# Patient Record
Sex: Female | Born: 1970 | Race: White | Hispanic: No | Marital: Single | Smoking: Former smoker
Health system: Southern US, Community
[De-identification: ages and names within clinical notes are randomized; demographics above are authoritative.]

## PROBLEM LIST (undated history)

## (undated) ENCOUNTER — Telehealth

## (undated) ENCOUNTER — Ambulatory Visit: Payer: PRIVATE HEALTH INSURANCE

## (undated) ENCOUNTER — Encounter

## (undated) ENCOUNTER — Encounter
Attending: Student in an Organized Health Care Education/Training Program | Primary: Student in an Organized Health Care Education/Training Program

## (undated) ENCOUNTER — Ambulatory Visit

## (undated) DIAGNOSIS — B977 Papillomavirus as the cause of diseases classified elsewhere: Secondary | ICD-10-CM

## (undated) DIAGNOSIS — J351 Hypertrophy of tonsils: Secondary | ICD-10-CM

## (undated) DIAGNOSIS — J3501 Chronic tonsillitis: Secondary | ICD-10-CM

## (undated) DIAGNOSIS — N200 Calculus of kidney: Secondary | ICD-10-CM

## (undated) DIAGNOSIS — IMO0002 Reserved for concepts with insufficient information to code with codable children: Secondary | ICD-10-CM

## (undated) DIAGNOSIS — R351 Nocturia: Secondary | ICD-10-CM

## (undated) DIAGNOSIS — N3090 Cystitis, unspecified without hematuria: Secondary | ICD-10-CM

## (undated) DIAGNOSIS — C801 Malignant (primary) neoplasm, unspecified: Secondary | ICD-10-CM

## (undated) DIAGNOSIS — N201 Calculus of ureter: Secondary | ICD-10-CM

---
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
## 2001-03-06 HISTORY — PX: TUBAL LIGATION: SHX77

## 2008-06-24 ENCOUNTER — Emergency Department: Payer: Self-pay | Admitting: Emergency Medicine

## 2008-12-11 ENCOUNTER — Emergency Department: Payer: Self-pay | Admitting: Emergency Medicine

## 2009-03-06 HISTORY — PX: OTHER SURGICAL HISTORY: SHX169

## 2009-09-12 ENCOUNTER — Emergency Department: Payer: Self-pay | Admitting: Emergency Medicine

## 2010-01-07 ENCOUNTER — Ambulatory Visit: Payer: Self-pay | Admitting: Family

## 2010-08-05 ENCOUNTER — Emergency Department: Payer: Self-pay | Admitting: Emergency Medicine

## 2010-11-18 ENCOUNTER — Emergency Department: Payer: Self-pay | Admitting: Emergency Medicine

## 2011-02-10 ENCOUNTER — Emergency Department: Payer: Self-pay | Admitting: Emergency Medicine

## 2011-03-09 ENCOUNTER — Other Ambulatory Visit: Payer: Self-pay | Admitting: Urology

## 2011-03-10 ENCOUNTER — Other Ambulatory Visit: Payer: Self-pay | Admitting: Urology

## 2011-03-10 DIAGNOSIS — N1330 Unspecified hydronephrosis: Secondary | ICD-10-CM

## 2011-03-16 ENCOUNTER — Encounter (HOSPITAL_COMMUNITY): Payer: Self-pay

## 2011-03-27 ENCOUNTER — Other Ambulatory Visit (HOSPITAL_COMMUNITY): Payer: Self-pay | Admitting: Physician Assistant

## 2011-03-29 ENCOUNTER — Other Ambulatory Visit: Payer: Self-pay | Admitting: Radiology

## 2011-03-30 ENCOUNTER — Encounter (HOSPITAL_COMMUNITY)
Admission: RE | Admit: 2011-03-30 | Discharge: 2011-03-30 | Disposition: A | Discharge status: Home / Self Care | Payer: Medicaid Other | Source: Ambulatory Visit | Attending: Urology | Admitting: Urology

## 2011-03-30 ENCOUNTER — Encounter (HOSPITAL_COMMUNITY): Payer: Self-pay

## 2011-03-30 HISTORY — DX: Nocturia: R35.1

## 2011-03-30 HISTORY — DX: Papillomavirus as the cause of diseases classified elsewhere: B97.7

## 2011-03-30 HISTORY — DX: Cystitis, unspecified without hematuria: N30.90

## 2011-03-30 LAB — CBC
HCT: 40.1 % (ref 36.0–46.0)
MCV: 89.3 fL (ref 78.0–100.0)
Platelets: 472 10*3/uL — ABNORMAL HIGH (ref 150–400)
RBC: 4.49 MIL/uL (ref 3.87–5.11)
WBC: 5.1 10*3/uL (ref 4.0–10.5)

## 2011-03-30 LAB — SURGICAL PCR SCREEN: Staphylococcus aureus: NEGATIVE

## 2011-03-30 NOTE — Patient Instructions (Signed)
20 MAURIANNA BENARD  03/30/2011   Your procedure is scheduled on: 04-03-2011 Report to [facility] radiology [phone] AM.  Call this number if you have problems the morning of surgery: [phone]   Remember:   Do not eat food:After Midnight.   take these medicines the morning of surgery with A SIP OF WATER:albuterol inhaler if needed and bring inhaler, hydrocodone if needed Remove piercing   Do not wear jewelry, make-up or nail polish.  Do not wear lotions, powders, or perfumes.  DO not wear deodorant.  Do not shave 48 hours prior to surgery.  Do not bring valuables to the hospital.  Contacts, dentures or bridgework may not be worn into surgery.  Leave suitcase in the car. After surgery it may be brought to your room.  For patients admitted to the hospital, checkout time is 11:00 AM the day of discharge.   special Instructions: CHG Shower Use Special Wash: 1/2 bottle night before surgery and 1/2 bottle morning of surgery.neck down avoid private area   Please read over the following fact sheets that you were given: MRSA Information, blood fact sheet Cain Sieve, rn [facility] pre op nurse phone number [phone]

## 2011-03-30 NOTE — Pre-Procedure Instructions (Signed)
ekg 08-05-2010 [facility] on chart

## 2011-03-31 NOTE — H&P (Signed)
Urology Admission H&P  Chief Complaint: Staghorn stone left kidney.  History of Present Illness:  Kristy Wallace presents today along with her fiance to further discuss her recently diagnosed staghorn left-sided renal calculus. Apparently she was unable to get any care in [place] and therefore was referred to [facility]. She has had 4-6 months of at least some degree of left-sided back pain but her problems have really intensified the last 2 months. The patient was diagnosed eventually with a staghorn stone involving the left kidney. Urine here last time did show some pyuria but her urine culture was negative. I suspect she will have some chronic pyuria simply based on inflammation from the stone. Approximately a month ago she did however have a febrile illness and probably did have some concurrent pyelonephritis. The patient had a KUB here several days ago which I have reviewed and she did bring the CT disk from [place] today. She has a very large 6 cm in length staghorn stone which appears to extend from the upper pole calyceal system down into the renal pelvis and probably near the ureteropelvic junction. They are here to discuss treatment options for this.       History of Present Illness            40 YO female seen today as a referral from Dr. H. Burns for evaluation of left staghorn renal calculus with mild left hydronephrosis seen on CTU 02/11/11. Has had chronic flank/back pain since 6/12. Recently seen in the ER for gross hematuria and flank pain. H/O nephrolithasis in 2003 with pregnancy which she was able to spontanously pass. Since that time has had not further stones. Developed fever 102 a month ago and was treated for ? UTI/pyelonephritis.   Interval HX: Today denies any further fever. Denies hematuria or passage of stone material. Continues to have nagging left flank pain that is controlled with Hydrocodone 5/500mg PRN.    Past Medical History  Diagnosis Date  . Asthma    exercise induced asthma  . Bladder infection     hx of bladder infection  . Frequent urination at night   . HPV (human papilloma virus) infection    Past Surgical History  Procedure Date  . Dilation and curettage of uterus 1993  . Tubal ligation 2003  . Colposcopy 2011    Home Medications:  No prescriptions prior to admission   Allergies:  Allergies  Allergen Reactions  . Advair Diskus (Fluticasone-Salmeterol) Other (See Comments)    Flu-like symptoms.  . Azithromycin Other (See Comments)    dizziness    No family history on file. Social History:  reports that she has been smoking Cigarettes.  She has a 6 pack-year smoking history. She has never used smokeless tobacco. She reports that she uses illicit drugs (Marijuana). She reports that she does not drink alcohol.  Review of Systems  Constitutional: Positive for malaise/fatigue. Negative for fever.  HENT: Negative.   Cardiovascular: Negative.   Gastrointestinal: Positive for abdominal pain. Negative for vomiting.  Genitourinary: Positive for frequency and flank pain.  Musculoskeletal: Negative.   Neurological: Negative.   Endo/Heme/Allergies: Negative.   Psychiatric/Behavioral: Negative.     Physical Exam:  Vital signs in last 24 hours:   Physical Exam  Constitutional: She is oriented to person, place, and time. She appears well-developed and well-nourished.  Cardiovascular: Normal rate and regular rhythm.   Respiratory: Effort normal.  GI: Soft. She exhibits no distension and no mass. There is no tenderness.  Musculoskeletal: Normal   range of motion.  Neurological: She is alert and oriented to person, place, and time.  Skin: Skin is warm and dry.  Psychiatric: She has a normal mood and affect. Her behavior is normal.    Laboratory Data:  No results found for this or any previous visit (from the past 24 hour(s)). Recent Results (from the past 240 hour(s))  SURGICAL PCR SCREEN     Status: Normal   Collection  Time   03/30/11  8:48 AM      Component Value Range Status Comment   MRSA, PCR NEGATIVE  NEGATIVE  Final    Staphylococcus aureus NEGATIVE  NEGATIVE  Final    Creatinine: No results found for this basename: CREATININE:7 in the last 168 hours Baseline Creatinine:   Impression/Assessment:  Left staghorn calculus.  Plan:  [place] has a substantial staghorn stone emanating from the upper pole of the left kidney extending through the renal pelvis and probably down into the proximal ureter, certainly into the UPJ region. She has had some intermittent pain now for at least a couple of months and has had at least one episode of what sounds like acute pyelonephritis. At this point there is no urgent need for intervention, but clearly she needs to have this stone definitively managed. Fortunately, the renal parenchyma appears preserved and she does not appear to have a substantial degree of hydronephrosis. Clearly the stone is not amenable to lithotripsy or ureteroscopy. Open procedures are an option but clearly a percutaneous nephrolithotomy would be the treatment of choice. It is possible that multiple access would be necessary, but hopefully through a lower pole access we would be able to take care of the stone within the renal pelvis and extending into the upper pole. Obviously the possibility exists that she would need a second look procedure or potentially a lithotripsy after the percutaneous procedure. Ideally we would render her stone free, but it may be more practical to really expect substantial debulking of the stone with potential additional therapy necessary to render her completely stone free. We talked about the surgery itself, the advantages as well as the risks. I would anticipate at least an overnight stay in the hospital and potentially a longer hospitalization depending on how she did clinically. We will attempt to get her on the schedule for some time in the next availability and provide the  disk to the interventional radiologist.   Kristy Wallace S 03/31/2011, 11:36 AM       

## 2011-04-03 ENCOUNTER — Other Ambulatory Visit (HOSPITAL_COMMUNITY): Payer: Medicaid Other

## 2011-04-03 ENCOUNTER — Inpatient Hospital Stay (HOSPITAL_COMMUNITY): Payer: Medicaid Other

## 2011-04-03 ENCOUNTER — Other Ambulatory Visit: Payer: Self-pay | Admitting: Urology

## 2011-04-03 ENCOUNTER — Encounter (HOSPITAL_COMMUNITY)
Admission: RE | Disposition: A | Discharge status: Home / Self Care | Payer: Self-pay | Source: Ambulatory Visit | Attending: Urology

## 2011-04-03 ENCOUNTER — Inpatient Hospital Stay (HOSPITAL_COMMUNITY): Payer: Medicaid Other | Admitting: Anesthesiology

## 2011-04-03 ENCOUNTER — Ambulatory Visit (HOSPITAL_COMMUNITY)
Admission: RE | Admit: 2011-04-03 | Discharge: 2011-04-03 | Disposition: A | Discharge status: Home / Self Care | Payer: Medicaid Other | Source: Ambulatory Visit | Attending: Urology | Admitting: Urology

## 2011-04-03 ENCOUNTER — Inpatient Hospital Stay (HOSPITAL_COMMUNITY)
Admission: RE | Admit: 2011-04-03 | Discharge: 2011-04-05 | DRG: 661 | Disposition: A | Discharge status: Home / Self Care | Payer: Medicaid Other | Source: Ambulatory Visit | Attending: Urology | Admitting: Urology

## 2011-04-03 ENCOUNTER — Encounter (HOSPITAL_COMMUNITY): Payer: Self-pay | Admitting: Anesthesiology

## 2011-04-03 DIAGNOSIS — N200 Calculus of kidney: Principal | ICD-10-CM | POA: Diagnosis present

## 2011-04-03 DIAGNOSIS — R109 Unspecified abdominal pain: Secondary | ICD-10-CM | POA: Diagnosis present

## 2011-04-03 DIAGNOSIS — F1210 Cannabis abuse, uncomplicated: Secondary | ICD-10-CM | POA: Diagnosis present

## 2011-04-03 DIAGNOSIS — J45909 Unspecified asthma, uncomplicated: Secondary | ICD-10-CM | POA: Diagnosis present

## 2011-04-03 DIAGNOSIS — N1330 Unspecified hydronephrosis: Secondary | ICD-10-CM

## 2011-04-03 DIAGNOSIS — R5381 Other malaise: Secondary | ICD-10-CM | POA: Diagnosis present

## 2011-04-03 DIAGNOSIS — F17200 Nicotine dependence, unspecified, uncomplicated: Secondary | ICD-10-CM | POA: Diagnosis present

## 2011-04-03 DIAGNOSIS — N3289 Other specified disorders of bladder: Secondary | ICD-10-CM | POA: Diagnosis not present

## 2011-04-03 DIAGNOSIS — Z01812 Encounter for preprocedural laboratory examination: Secondary | ICD-10-CM

## 2011-04-03 HISTORY — PX: OTHER SURGICAL HISTORY: SHX169

## 2011-04-03 LAB — TYPE AND SCREEN
ABO/RH(D): AB POS
Antibody Screen: NEGATIVE

## 2011-04-03 SURGERY — NEPHROLITHOTOMY PERCUTANEOUS
Anesthesia: General | Laterality: Left | Wound class: Clean Contaminated

## 2011-04-03 MED ORDER — SODIUM CHLORIDE 0.9 % IR SOLN
Status: DC | PRN
  Administered 2011-04-03: 18000 mL

## 2011-04-03 MED ORDER — DEXTROSE 5 % IV SOLN
1.0000 g | INTRAVENOUS | Status: DC
  Administered 2011-04-03 – 2011-04-05 (×3): 1 g via INTRAVENOUS
  Filled 2011-04-03 (×4): qty 10

## 2011-04-03 MED ORDER — LACTATED RINGERS IV SOLN
INTRAVENOUS | Status: DC
  Administered 2011-04-03: 14:00:00 via INTRAVENOUS

## 2011-04-03 MED ORDER — IOHEXOL 300 MG/ML  SOLN
50.0000 mL | Freq: Once | INTRAMUSCULAR | Status: AC | PRN
  Administered 2011-04-03: 20 mL

## 2011-04-03 MED ORDER — ONDANSETRON HCL 4 MG/2ML IJ SOLN
INTRAMUSCULAR | Status: DC | PRN
  Administered 2011-04-03: 4 mg via INTRAVENOUS

## 2011-04-03 MED ORDER — MIDAZOLAM HCL 5 MG/5ML IJ SOLN
INTRAMUSCULAR | Status: DC | PRN
  Administered 2011-04-03: 2 mg via INTRAVENOUS

## 2011-04-03 MED ORDER — FENTANYL CITRATE 0.05 MG/ML IJ SOLN
INTRAMUSCULAR | Status: AC | PRN
  Administered 2011-04-03 (×2): 100 ug via INTRAVENOUS

## 2011-04-03 MED ORDER — FENTANYL CITRATE 0.05 MG/ML IJ SOLN
INTRAMUSCULAR | Status: AC
  Filled 2011-04-03: qty 2

## 2011-04-03 MED ORDER — OXYBUTYNIN CHLORIDE 5 MG PO TABS
5.0000 mg | ORAL_TABLET | Freq: Three times a day (TID) | ORAL | Status: DC | PRN
  Administered 2011-04-04 – 2011-04-05 (×4): 5 mg via ORAL
  Filled 2011-04-03 (×5): qty 1

## 2011-04-03 MED ORDER — DEXTROSE 5 % IV SOLN
1.0000 g | Freq: Once | INTRAVENOUS | Status: DC
  Filled 2011-04-03: qty 10

## 2011-04-03 MED ORDER — HYDROMORPHONE HCL PF 1 MG/ML IJ SOLN
0.5000 mg | INTRAMUSCULAR | Status: DC | PRN
  Administered 2011-04-03 – 2011-04-05 (×15): 1 mg via INTRAVENOUS
  Filled 2011-04-03 (×15): qty 1

## 2011-04-03 MED ORDER — FENTANYL CITRATE 0.05 MG/ML IJ SOLN
INTRAMUSCULAR | Status: DC | PRN
  Administered 2011-04-03: 50 ug via INTRAVENOUS
  Administered 2011-04-03: 100 ug via INTRAVENOUS
  Administered 2011-04-03: 50 ug via INTRAVENOUS

## 2011-04-03 MED ORDER — IOHEXOL 300 MG/ML  SOLN
INTRAMUSCULAR | Status: AC
  Filled 2011-04-03: qty 2

## 2011-04-03 MED ORDER — DROPERIDOL 2.5 MG/ML IJ SOLN
INTRAMUSCULAR | Status: DC | PRN
  Administered 2011-04-03: 0.625 mg via INTRAVENOUS

## 2011-04-03 MED ORDER — MIDAZOLAM HCL 5 MG/5ML IJ SOLN
INTRAMUSCULAR | Status: AC | PRN
  Administered 2011-04-03 (×2): 2 mg via INTRAVENOUS

## 2011-04-03 MED ORDER — CIPROFLOXACIN IN D5W 400 MG/200ML IV SOLN
400.0000 mg | INTRAVENOUS | Status: AC
  Administered 2011-04-03: 400 mg via INTRAVENOUS
  Filled 2011-04-03: qty 200

## 2011-04-03 MED ORDER — FENTANYL CITRATE 0.05 MG/ML IJ SOLN
25.0000 ug | INTRAMUSCULAR | Status: DC | PRN
  Administered 2011-04-03 (×3): 50 ug via INTRAVENOUS

## 2011-04-03 MED ORDER — ROCURONIUM BROMIDE 100 MG/10ML IV SOLN
INTRAVENOUS | Status: DC | PRN
  Administered 2011-04-03: 40 mg via INTRAVENOUS
  Administered 2011-04-03: 15 mg via INTRAVENOUS

## 2011-04-03 MED ORDER — PROMETHAZINE HCL 25 MG/ML IJ SOLN: 6.2500 mg | INTRAMUSCULAR | Status: DC | PRN

## 2011-04-03 MED ORDER — ONDANSETRON HCL 4 MG/2ML IJ SOLN: 4.0000 mg | INTRAMUSCULAR | Status: DC | PRN

## 2011-04-03 MED ORDER — IOHEXOL 300 MG/ML  SOLN
INTRAMUSCULAR | Status: DC | PRN
  Administered 2011-04-03: 10 mL via INTRAVENOUS

## 2011-04-03 MED ORDER — PROPOFOL 10 MG/ML IV EMUL
INTRAVENOUS | Status: DC | PRN
  Administered 2011-04-03: 100 mg via INTRAVENOUS

## 2011-04-03 MED ORDER — OXYCODONE-ACETAMINOPHEN 5-325 MG PO TABS
1.0000 | ORAL_TABLET | ORAL | Status: DC | PRN
  Administered 2011-04-04 – 2011-04-05 (×3): 2 via ORAL
  Filled 2011-04-03 (×3): qty 2

## 2011-04-03 MED ORDER — ALBUTEROL SULFATE HFA 108 (90 BASE) MCG/ACT IN AERS
2.0000 | INHALATION_SPRAY | Freq: Four times a day (QID) | RESPIRATORY_TRACT | Status: DC | PRN
  Filled 2011-04-03: qty 6.7

## 2011-04-03 MED ORDER — SODIUM CHLORIDE 0.9 % IV SOLN: INTRAVENOUS | Status: DC

## 2011-04-03 MED ORDER — LACTATED RINGERS IV SOLN
INTRAVENOUS | Status: DC | PRN
  Administered 2011-04-03 (×2): via INTRAVENOUS

## 2011-04-03 MED ORDER — KCL IN DEXTROSE-NACL 20-5-0.45 MEQ/L-%-% IV SOLN
INTRAVENOUS | Status: DC
  Administered 2011-04-03: 100 mL/h via INTRAVENOUS
  Administered 2011-04-04 – 2011-04-05 (×3): via INTRAVENOUS
  Filled 2011-04-03 (×7): qty 1000

## 2011-04-03 SURGICAL SUPPLY — 42 items
BAG URINE DRAINAGE (UROLOGICAL SUPPLIES) ×2 IMPLANT
BASKET ZERO TIP NITINOL 2.4FR (BASKET) IMPLANT
BENZOIN TINCTURE PRP APPL 2/3 (GAUZE/BANDAGES/DRESSINGS) ×4 IMPLANT
CATH FOLEY 2W COUNCIL 20FR 5CC (CATHETERS) IMPLANT
CATH FOLEY 2W COUNCIL 5CC 18FR (CATHETERS) ×2 IMPLANT
CATH ROBINSON RED A/P 20FR (CATHETERS) IMPLANT
CATH URET 5FR 28IN OPEN ENDED (CATHETERS) ×2 IMPLANT
CATH X-FORCE N30 NEPHROSTOMY (TUBING) ×2 IMPLANT
CLOTH BEACON ORANGE TIMEOUT ST (SAFETY) ×2 IMPLANT
COVER SURGICAL LIGHT HANDLE (MISCELLANEOUS) ×2 IMPLANT
DRAPE C-ARM 42X72 X-RAY (DRAPES) ×2 IMPLANT
DRAPE CAMERA CLOSED 9X96 (DRAPES) ×2 IMPLANT
DRAPE LINGEMAN PERC (DRAPES) ×2 IMPLANT
DRAPE SURG IRRIG POUCH 19X23 (DRAPES) ×2 IMPLANT
DRSG TEGADERM 8X12 (GAUZE/BANDAGES/DRESSINGS) ×4 IMPLANT
GLOVE BIOGEL M STRL SZ7.5 (GLOVE) ×2 IMPLANT
GOWN STRL REIN XL XLG (GOWN DISPOSABLE) ×4 IMPLANT
KIT BASIN OR (CUSTOM PROCEDURE TRAY) ×2 IMPLANT
LASER FIBER DISP (UROLOGICAL SUPPLIES) IMPLANT
LASER FIBER DISP 1000U (UROLOGICAL SUPPLIES) IMPLANT
MANIFOLD NEPTUNE II (INSTRUMENTS) ×2 IMPLANT
NS IRRIG 1000ML POUR BTL (IV SOLUTION) ×2 IMPLANT
PACK BASIC VI WITH GOWN DISP (CUSTOM PROCEDURE TRAY) ×2 IMPLANT
PAD ABD 7.5X8 STRL (GAUZE/BANDAGES/DRESSINGS) ×4 IMPLANT
POSITIONER SURGICAL ARM (MISCELLANEOUS) ×2 IMPLANT
PROBE EHL 9 FR 470CM (MISCELLANEOUS) IMPLANT
PROBE LITHOCLAST ULTRA 3.8X403 (UROLOGICAL SUPPLIES) ×2 IMPLANT
PROBE PNEUMATIC 1.0MMX570MM (UROLOGICAL SUPPLIES) ×2 IMPLANT
SET IRRIG Y TYPE TUR BLADDER L (SET/KITS/TRAYS/PACK) IMPLANT
SET WARMING FLUID IRRIGATION (MISCELLANEOUS) ×2 IMPLANT
SPONGE GAUZE 4X4 12PLY (GAUZE/BANDAGES/DRESSINGS) ×2 IMPLANT
SPONGE LAP 4X18 X RAY DECT (DISPOSABLE) ×2 IMPLANT
STONE CATCHER W/TUBE ADAPTER (UROLOGICAL SUPPLIES) ×2 IMPLANT
SUT SILK 2 0 30  PSL (SUTURE) ×1
SUT SILK 2 0 30 PSL (SUTURE) ×1 IMPLANT
SYR 20CC LL (SYRINGE) ×4 IMPLANT
SYRINGE 10CC LL (SYRINGE) ×2 IMPLANT
TOWEL OR 17X26 10 PK STRL BLUE (TOWEL DISPOSABLE) ×2 IMPLANT
TOWEL OR NON WOVEN STRL DISP B (DISPOSABLE) ×2 IMPLANT
TRAY FOLEY CATH 14FRSI W/METER (CATHETERS) ×2 IMPLANT
TUBING CONNECTING 10 (TUBING) ×4 IMPLANT
WATER STERILE IRR 1500ML POUR (IV SOLUTION) ×2 IMPLANT

## 2011-04-03 NOTE — Procedures (Signed)
Placement of left nephrostomy tube.  Large left renal staghorn calculus.  Catheter tip in urinary bladder.

## 2011-04-03 NOTE — Interval H&P Note (Signed)
History and Physical Interval Note:  04/03/2011 10:59 AM  Kristy Wallace  has presented today for surgery, with the diagnosis of Left Staghorn Stone  The various methods of treatment have been discussed with the patient and family. After consideration of risks, benefits and other options for treatment, the patient has consented to  Procedure(s): NEPHROLITHOTOMY PERCUTANEOUS as a surgical intervention .  The patients' history has been reviewed, patient examined, no change in status, stable for surgery.  I have reviewed the patients' chart and labs.  Questions were answered to the patient's satisfaction.     Narely Nobles S

## 2011-04-03 NOTE — Anesthesia Preprocedure Evaluation (Signed)
Anesthesia Evaluation  Patient identified by MRN, date of birth, ID band Patient awake    Reviewed: Allergy & Precautions, H&P , NPO status , Patient's Chart, lab work & pertinent test results  Airway Mallampati: II TM Distance: >3 FB Neck ROM: Full    Dental No notable dental hx.    Pulmonary asthma , Current Smoker,  clear to auscultation  Pulmonary exam normal       Cardiovascular neg cardio ROS Regular Normal    Neuro/Psych Negative Neurological ROS  Negative Psych ROS   GI/Hepatic negative GI ROS, Neg liver ROS,   Endo/Other  Negative Endocrine ROS  Renal/GU negative Renal ROS  Genitourinary negative   Musculoskeletal negative musculoskeletal ROS (+)   Abdominal   Peds negative pediatric ROS (+)  Hematology negative hematology ROS (+)   Anesthesia Other Findings   Reproductive/Obstetrics negative OB ROS                           Anesthesia Physical Anesthesia Plan  ASA: II  Anesthesia Plan: General   Post-op Pain Management:    Induction: Intravenous  Airway Management Planned: Oral ETT  Additional Equipment:   Intra-op Plan:   Post-operative Plan: Extubation in OR  Informed Consent: I have reviewed the patients History and Physical, chart, labs and discussed the procedure including the risks, benefits and alternatives for the proposed anesthesia with the patient or authorized representative who has indicated his/her understanding and acceptance.   Dental advisory given  Plan Discussed with: CRNA  Anesthesia Plan Comments:         Anesthesia Quick Evaluation

## 2011-04-03 NOTE — H&P (Signed)
Kristy Wallace is an 41 y.o. female.   Chief Complaint: Left kidney stone HPI: Scheduled for left nephrolithotomy by Dr. Isabel Caprice.  History of left flank pain and found to have a staghorn left renal calculus during ED visit.  No flank pain today.  No gross hematuria.    Past Medical History  Diagnosis Date  . Asthma     exercise induced asthma  . Bladder infection     hx of bladder infection  . Frequent urination at night   . HPV (human papilloma virus) infection     Past Surgical History  Procedure Date  . Dilation and curettage of uterus 1993  . Tubal ligation 2003  . Colposcopy 2011    No family history on file. Social History: 1/2 pack smoking per day. Allergies:  Allergies  Allergen Reactions  . Advair Diskus (Fluticasone-Salmeterol) Other (See Comments)    Flu-like symptoms.  . Azithromycin Other (See Comments)    dizziness    Medications Prior to Admission  Medication Sig Dispense Refill  . albuterol (PROVENTIL HFA;VENTOLIN HFA) 108 (90 BASE) MCG/ACT inhaler Inhale 2 puffs into the lungs every 6 (six) hours as needed. For exercise induced asthma.      Marland Kitchen HYDROcodone-acetaminophen (VICODIN) 5-500 MG per tablet Take 1 tablet by mouth every 6 (six) hours as needed. For pain.      Marland Kitchen trimethoprim (TRIMPEX) 100 MG tablet Take 100 mg by mouth daily.       Medications Prior to Admission  Medication Dose Route Frequency Provider Last Rate Last Dose  . 0.9 %  sodium chloride infusion   Intravenous Continuous Art A Hoss, MD      . ciprofloxacin (CIPRO) IVPB 400 mg  400 mg Intravenous On Call Art A Hoss, MD        No results found for this or any previous visit (from the past 48 hour(s)). No results found.  Review of Systems  Constitutional: Negative.   Respiratory: Negative.   Cardiovascular: Negative.   Genitourinary: Negative.     Blood pressure 127/87, pulse 102, temperature 98.5 F (36.9 C), resp. rate 21, last menstrual period 03/02/2011, SpO2 100.00%. Physical Exam   Constitutional: She appears well-developed and well-nourished.  HENT:  Head: Normocephalic.  Cardiovascular: Normal rate, regular rhythm and normal heart sounds.   Respiratory: Effort normal and breath sounds normal.  GI: Soft.     Assessment/Plan Plan for left percutaneous nephrostomy and dilatation of percutaneous access in OR with Dr. Isabel Caprice.  Patient understands the risks and benefits of the procedure and signed informed consent.   Smiley Birr RYAN 04/03/2011, 8:13 AM

## 2011-04-03 NOTE — Anesthesia Postprocedure Evaluation (Signed)
  Anesthesia Post-op Note  Patient: Kristy Wallace  Procedure(s) Performed:  NEPHROLITHOTOMY PERCUTANEOUS -     Patient Location: PACU  Anesthesia Type: General  Level of Consciousness: awake and alert   Airway and Oxygen Therapy: Patient Spontanous Breathing  Post-op Pain: mild  Post-op Assessment: Post-op Vital signs reviewed, Patient's Cardiovascular Status Stable, Respiratory Function Stable, Patent Airway and No signs of Nausea or vomiting  Post-op Vital Signs: stable  Complications: No apparent anesthesia complications

## 2011-04-03 NOTE — Transfer of Care (Signed)
Immediate Anesthesia Transfer of Care Note  Patient: Kristy Wallace  Procedure(s) Performed:  NEPHROLITHOTOMY PERCUTANEOUS -     Patient Location: PACU  Anesthesia Type: General  Level of Consciousness: sedated and patient cooperative  Airway & Oxygen Therapy: Patient Spontanous Breathing and Patient connected to face mask oxygen  Post-op Assessment: Report given to PACU RN and Post -op Vital signs reviewed and stable  Post vital signs: Reviewed and stable  Complications: No apparent anesthesia complications

## 2011-04-04 ENCOUNTER — Inpatient Hospital Stay (HOSPITAL_COMMUNITY): Payer: Medicaid Other

## 2011-04-04 LAB — BASIC METABOLIC PANEL
CO2: 24 mEq/L (ref 19–32)
Chloride: 103 mEq/L (ref 96–112)
Sodium: 134 mEq/L — ABNORMAL LOW (ref 135–145)

## 2011-04-04 LAB — HEMOGLOBIN AND HEMATOCRIT, BLOOD: HCT: 34 % — ABNORMAL LOW (ref 36.0–46.0)

## 2011-04-04 NOTE — Progress Notes (Signed)
Patient ID: Kristy Wallace, female   DOB: 1970/04/07, 41 y.o.   MRN: [id number] 1 Day Post-Op Subjective: Patient reports " kidney spasms". Hungry. No major issues over night.  Objective: Vital signs in last 24 hours: Temp:  [97.9 F (36.6 C)-99.2 F (37.3 C)] 98.3 F (36.8 C) (01/29 0645) Pulse Rate:  [76-107] 96  (01/29 0645) Resp:  [6-20] 16  (01/29 0645) BP: (111-141)/(65-98) 123/72 mmHg (01/29 0645) SpO2:  [100 %] 100 % (01/29 0645) Weight:  [65.318 kg (144 lb)] 65.318 kg (144 lb) (01/28 1521)  Intake/Output from previous day: 01/28 0701 - 01/29 0700 In: 3421.7 [P.O.:240; I.V.:3181.7] Out: 2375 [Urine:1100] Intake/Output this shift:    Physical Exam:  Constitutional: Vital signs reviewed. WD WN in NAD   Eyes: PERRL, No scleral icterus.   Cardiovascular: RRR Pulmonary/Chest: Normal effort Abdominal: Soft. Moderate LUQ and flank tenderness, non-distended, bowel sounds are normal, no masses, organomegaly, or guarding present.  Genitourinary: Extremities: No cyanosis or edema   Lab Results:  Basename 04/04/11 0415 04/03/11 1429  HGB 11.3* 11.3*  HCT 34.0* 33.1*   BMET  Basename 04/04/11 0415  NA 134*  K 3.7  CL 103  CO2 24  GLUCOSE 123*  BUN 5*  CREATININE 0.69  CALCIUM 8.4   No results found for this basename: LABPT:3,INR:3 in the last 72 hours No results found for this basename: LABURIN:1 in the last 72 hours Results for orders placed during the hospital encounter of 03/30/11  SURGICAL PCR SCREEN     Status: Normal   Collection Time   03/30/11  8:48 AM      Component Value Range Status Comment   MRSA, PCR NEGATIVE  NEGATIVE  Final    Staphylococcus aureus NEGATIVE  NEGATIVE  Final     Studies/Results: Ir Perc Nephrostomy Left  04/03/2011  *RADIOLOGY REPORT*  Clinical history:41 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 Scheduled for percutaneous nephrolithotomy procedure.  PROCEDURE(S): ULTRASOUND AND FLUOROSCOPIC GUIDED LEFT PERCUTANEOUS NEPHROSTOMY  TUBE; DILATATION OF PERCUTANEOUS NEPHROSTOMY TUBE FOR THE NEPHROLITHOTOMY PROCEDURE.; PLACEMENT OF AN ANTEGRADE URETERAL STENT  Physician: Rachelle Hora. Henn, MD  Medications:Versed 4 mg, Fentanyl 200 mcg. A radiology nurse monitored the patient for moderate sedation.  Ciprofloxacin was given within two hours of incision.  Moderate sedation time:20 minutes  Fluoroscopy time: 3.5 minutes  Procedure:The procedure was explained to the patient.  The risks and benefits of the procedure were discussed and the patient's questions were addressed.  Informed consent was obtained from the patient.  The patient was placed prone.  The left flank was prepped and draped in a sterile fashion.  Ultrasound demonstrated mild dilatation of the lower pole collecting system.  The skin was anesthetized with lidocaine.  Using ultrasound guidance, a 22 gauge needle was directed into the lower pole calix.  Contrast was injected to confirm placement in the renal collecting system.   A 0.018 wire and dilator set was placed.  A 5-French Kumpe catheter was advanced into the collecting system and a Glidewire was advanced into the left ureter and urinary bladder.  Catheter was advanced into the urinary bladder.  Catheter was sutured to the skin.  The second portion of the procedure was performed in the operating room with Dr. Isabel Caprice. The patient was under general anesthesia. The left flank was prepped and draped in a sterile fashion.  The suture was removed.  An Amplatz wire was placed and the 5-French catheter was removed.  A peel-away sheath was placed.  A second Amplatz wire  was advanced into the urinary bladder.  The tract was dilated with a 10 mm balloon.  Trocar was advanced over the balloon and into the lower pole collecting system.  Stone removal was performed by Dr. Isabel Caprice.  At the end of the procedure, an antegrade stent was needed.  Attempted to place an 8 French x 26 cm stent but the stent would not easily advanced beyond the ureterovesical  junction.  As a result, 7 French x 26 cm stent was placed.  The distal aspect was reconstituted in the urinary bladder.  Proximal aspect reconstituted in the renal pelvis.  A large bore nephrostomy tube was placed within the renal collecting system.  Findings:Large staghorn calculus involving the left kidney upper pole.  Mild dilatation of the lower pole collecting system by ultrasound.  Access was obtained from the lower pole calix.  Tract was successfully dilated in the operating room and approximately 80% of the stone burden was removed based on fluoroscopy.  The proximal aspect of the ureter stent is at the UPJ and the distal aspect appears to be within the urinary bladder.  The nephrostomy tube is in the renal pelvis.  Irregularity of the upper pole collecting system consistent with recent stone removal.  Complications: None  Impression:Successful placement of a left percutaneous nephrostomy tube for a nephrolithotomy procedure.  Approximately 80% of the stone burden was removed.  Successful placement of an antegrade ureteral stent.  Original Report Authenticated By: Richarda Overlie, M.D.   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 Scheduled for percutaneous nephrolithotomy procedure.  PROCEDURE(S): ULTRASOUND AND FLUOROSCOPIC GUIDED LEFT PERCUTANEOUS NEPHROSTOMY TUBE; DILATATION OF PERCUTANEOUS NEPHROSTOMY TUBE FOR THE NEPHROLITHOTOMY PROCEDURE.; PLACEMENT OF AN ANTEGRADE URETERAL STENT  Physician: Rachelle Hora. Henn, MD  Medications:Versed 4 mg, Fentanyl 200 mcg. A radiology nurse monitored the patient for moderate sedation.  Ciprofloxacin was given within two hours of incision.  Moderate sedation time:20 minutes  Fluoroscopy time: 3.5 minutes  Procedure:The procedure was explained to the patient.  The risks and benefits of the procedure were discussed and the patient's questions were addressed.  Informed consent was obtained from the patient.   The patient was placed prone.  The left flank was prepped and draped in a sterile fashion.  Ultrasound demonstrated mild dilatation of the lower pole collecting system.  The skin was anesthetized with lidocaine.  Using ultrasound guidance, a 22 gauge needle was directed into the lower pole calix.  Contrast was injected to confirm placement in the renal collecting system.   A 0.018 wire and dilator set was placed.  A 5-French Kumpe catheter was advanced into the collecting system and a Glidewire was advanced into the left ureter and urinary bladder.  Catheter was advanced into the urinary bladder.  Catheter was sutured to the skin.  The second portion of the procedure was performed in the operating room with Dr. Isabel Caprice. The patient was under general anesthesia. The left flank was prepped and draped in a sterile fashion.  The suture was removed.  An Amplatz wire was placed and the 5-French catheter was removed.  A peel-away sheath was placed.  A second Amplatz wire was advanced into the urinary bladder.  The tract was dilated with a 10 mm balloon.  Trocar was advanced over the balloon and into the lower pole collecting system.  Stone removal was performed by Dr. Isabel Caprice.  At the end of the procedure, an antegrade stent was needed.  Attempted  to place an 8 [place] x 26 cm stent but the stent would not easily advanced beyond the ureterovesical junction.  As a result, 7 French x 26 cm stent was placed.  The distal aspect was reconstituted in the urinary bladder.  Proximal aspect reconstituted in the renal pelvis.  A large bore nephrostomy tube was placed within the renal collecting system.  Findings:Large staghorn calculus involving the left kidney upper pole.  Mild dilatation of the lower pole collecting system by ultrasound.  Access was obtained from the lower pole calix.  Tract was successfully dilated in the operating room and approximately 80% of the stone burden was removed based on fluoroscopy.  The proximal aspect  of the ureter stent is at the UPJ and the distal aspect appears to be within the urinary bladder.  The nephrostomy tube is in the renal pelvis.  Irregularity of the upper pole collecting system consistent with recent stone removal.  Complications: None  Impression:Successful placement of a left percutaneous nephrostomy tube for a nephrolithotomy procedure.  Approximately 80% of the stone burden was removed.  Successful placement of an antegrade ureteral stent.  Original Report Authenticated By: Richarda Overlie, M.D.   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 Scheduled for percutaneous nephrolithotomy procedure.  PROCEDURE(S): ULTRASOUND AND FLUOROSCOPIC GUIDED LEFT PERCUTANEOUS NEPHROSTOMY TUBE; DILATATION OF PERCUTANEOUS NEPHROSTOMY TUBE FOR THE NEPHROLITHOTOMY PROCEDURE.; PLACEMENT OF AN ANTEGRADE URETERAL STENT  Physician: Rachelle Hora. Henn, MD  Medications:Versed 4 mg, Fentanyl 200 mcg. A radiology nurse monitored the patient for moderate sedation.  Ciprofloxacin was given within two hours of incision.  Moderate sedation time:20 minutes  Fluoroscopy time: 3.5 minutes  Procedure:The procedure was explained to the patient.  The risks and benefits of the procedure were discussed and the patient's questions were addressed.  Informed consent was obtained from the patient.  The patient was placed prone.  The left flank was prepped and draped in a sterile fashion.  Ultrasound demonstrated mild dilatation of the lower pole collecting system.  The skin was anesthetized with lidocaine.  Using ultrasound guidance, a 22 gauge needle was directed into the lower pole calix.  Contrast was injected to confirm placement in the renal collecting system.   A 0.018 wire and dilator set was placed.  A 5-French Kumpe catheter was advanced into the collecting system and a Glidewire was advanced into the left ureter and urinary bladder.  Catheter was advanced into  the urinary bladder.  Catheter was sutured to the skin.  The second portion of the procedure was performed in the operating room with Dr. Isabel Caprice. The patient was under general anesthesia. The left flank was prepped and draped in a sterile fashion.  The suture was removed.  An Amplatz wire was placed and the 5-French catheter was removed.  A peel-away sheath was placed.  A second Amplatz wire was advanced into the urinary bladder.  The tract was dilated with a 10 mm balloon.  Trocar was advanced over the balloon and into the lower pole collecting system.  Stone removal was performed by Dr. Isabel Caprice.  At the end of the procedure, an antegrade stent was needed.  Attempted to place an 8 French x 26 cm stent but the stent would not easily advanced beyond the ureterovesical junction.  As a result, 7 French x 26 cm stent was placed.  The distal aspect was reconstituted in the urinary bladder.  Proximal aspect reconstituted in the renal pelvis.  A large bore nephrostomy tube was placed within the renal collecting system.  Findings:Large staghorn calculus involving the left kidney upper pole.  Mild dilatation of the lower pole collecting system by ultrasound.  Access was obtained from the lower pole calix.  Tract was successfully dilated in the operating room and approximately 80% of the stone burden was removed based on fluoroscopy.  The proximal aspect of the ureter stent is at the UPJ and the distal aspect appears to be within the urinary bladder.  The nephrostomy tube is in the renal pelvis.  Irregularity of the upper pole collecting system consistent with recent stone removal.  Complications: None  Impression:Successful placement of a left percutaneous nephrostomy tube for a nephrolithotomy procedure.  Approximately 80% of the stone burden was removed.  Successful placement of an antegrade ureteral stent.  Original Report Authenticated By: Richarda Overlie, M.D.   Dg C-arm 61-120 Min-no Report  04/03/2011  CLINICAL DATA:  intra op   C-ARM 61-120 MINUTES  Fluoroscopy was utilized by the requesting physician.  No radiographic  interpretation.      Assessment/Plan:   Doing ok. Will check CT report. Probable d/c 1-30 AM   LOS: 1 day   Victory Strollo S 04/04/2011, 8:05 AM

## 2011-04-05 ENCOUNTER — Encounter (HOSPITAL_COMMUNITY): Payer: Self-pay | Admitting: Urology

## 2011-04-05 MED ORDER — OXYBUTYNIN CHLORIDE 5 MG PO TABS
10.0000 mg | ORAL_TABLET | Freq: Three times a day (TID) | ORAL | Status: DC | PRN
  Administered 2011-04-05: 10 mg via ORAL
  Filled 2011-04-05: qty 2

## 2011-04-05 MED ORDER — OXYCODONE-ACETAMINOPHEN 5-325 MG PO TABS: 1.0000 | ORAL_TABLET | ORAL | Status: AC | PRN

## 2011-04-05 MED ORDER — OXYBUTYNIN CHLORIDE 5 MG PO TABS: 10.0000 mg | ORAL_TABLET | Freq: Three times a day (TID) | ORAL | Status: DC | PRN

## 2011-04-05 NOTE — Op Note (Signed)
Preoperative diagnosis: Large left partial staghorn calculus. Postoperative diagnosis: Same  Procedure: Left percutaneous nephrolithotomy   Surgeon: Valetta Fuller M.D.  Anesthesia: Gen.  Indications: Patient was sent to see as reason the office with a very large partial left staghorn calculus. The stone was not causing significant obstruction but measured over 6 cm in length. We underwent significant discussion with the patient with regard to treatment options. We felt that a percutaneous nephrolithotomy with certainly the most reasonable way to attempt to treat the stone. She understood the advantages and disadvantages and potential risks of percutaneous nephrolithotomy. She understood that multiple procedures may be necessary given the size of the stone. This could include a second percutaneous procedure or potentially lithotripsy. She is given full informed consent and presents now for the procedure. She is received perioperative antibiotics and placement of PAS compression boots.     Technique and findings: Patient was brought to the operating room she had successful induction of general and endotracheal anesthesia. She was then carefully placed in the prone position with all extremities carefully padded. A Foley catheter artery been inserted. Appropriate surgical timeout was performed. Dr. Lowella Dandy from interventional radiology perform the initial dilation and establishment of the nephrostomy tract. With insertion of the rigid nephroscope we encountered a very large stone which did enter the renal pelvis. The lithotrite instrument was used for both ultrasonic as well as pneumatic fracturing of the stone. We continued upward into the upper aspects of the upper pole. Approximate hour and half to 2 hours of the procedure were performed. We were unable to access the last portion of the stone. On fluoroscopy it appeared that we had debulked the stone down to 20-30% of its original size. Dr. hand placed a  double-J stent with good positioning confirmed. We then used an 44 [place] council tip Foley catheter as nephrostomy tube. Urine was moderately bloody. The patient had no obvious clinical problems he remained hemodynamically stable. She was brought to recovery room in stable condition.

## 2011-04-05 NOTE — Progress Notes (Signed)
Kristy Wallace ID: Kristy Wallace, female   DOB: Nov 29, 1970, 41 y.o.   MRN: [id number] 2 Days Post-Op Subjective: Kristy Wallace reports moderate flank pain and "m kidney and bladder spasms"  Objective: Vital signs in last 24 hours: Temp:  [98.3 F (36.8 C)-99.5 F (37.5 C)] 99.3 F (37.4 C) (01/30 0550) Pulse Rate:  [86-97] 89  (01/30 0550) Resp:  [16-17] 17  (01/30 0550) BP: (114-117)/(69-71) 117/71 mmHg (01/30 0550) SpO2:  [96 %-97 %] 96 % (01/30 0550)  Intake/Output from previous day:  Intake/Output this shift:    Physical Exam:  Constitutional: Vital signs reviewed. WD WN in NAD   Eyes: PERRL, No scleral icterus.   Cardiovascular: RRR Pulmonary/Chest: Normal effort Abdominal: Soft. Positive LUQ and CVAT, non-distended, bowel sounds are normal, no masses, organomegaly, or guarding present.  Genitourinary: Extremities: No cyanosis or edema   Lab Results:  Basename 04/04/11 0415 04/03/11 1429  HGB 11.3* 11.3*  HCT 34.0* 33.1*   BMET  Basename 04/04/11 0415  NA 134*  K 3.7  CL 103  CO2 24  GLUCOSE 123*  BUN 5*  CREATININE 0.69  CALCIUM 8.4   No results found for this basename: LABPT:3,INR:3 in the last 72 hours No results found for this basename: LABURIN:1 in the last 72 hours Results for orders placed during the hospital encounter of 03/30/11  SURGICAL PCR SCREEN     Status: Normal   Collection Time   03/30/11  8:48 AM      Component Value Range Status Comment   MRSA, PCR NEGATIVE  NEGATIVE  Final    Staphylococcus aureus NEGATIVE  NEGATIVE  Final     Studies/Results: Ct Abdomen Pelvis Wo Contrast  04/04/2011  *RADIOLOGY REPORT*  Clinical Data: Follow-up staghorn calculus status post left nephrostomy 04/03/2011.  CT ABDOMEN AND PELVIS WITHOUT CONTRAST  Technique:  Multidetector CT imaging of the abdomen and pelvis was performed following the standard protocol without intravenous contrast.  Comparison: Images from nephrostomy placement 04/03/2011 and one- view abdomen  03/02/2011.  Findings: Left percutaneous nephrostomy tip is within the left renal pelvis.  There is a left ureteral stent extending from the ureteral pelvic junction into the bladder.  Fragments of the residual staghorn calculus are present within the upper pole calyces, measuring up to 3.4 cm in diameter.  There is mild collecting system dilatation and mild perinephric soft tissue stranding.  A small amount of air is present within the left collecting system and the bladder.  There are no stone fragments along the course of the ureteral stent.  There is no evidence of stone fragment in the urinary bladder.  A tiny calculus is present in the lower pole of the right kidney. There is no right-sided collecting system dilatation.  Mild atelectasis is present at both lung bases.  The liver, spleen, pancreas, gallbladder and adrenal glands demonstrate no abnormalities.  There is a small amount of soft tissue edema within the left flank and within the left retroperitoneum, attributed to nephrostomy placement.  No drainable fluid collection is identified. Retroverted uterus is noted.  There is no adnexal mass  IMPRESSION:  1. Percutaneous nephrostomy and ureteral stent placement without demonstrated complication. 2.  Fragmented staghorn calculus demonstrates residual fragments in the upper pole calyces.  No stone fragments are seen along the course of the stent or within the bladder. 3.  Mild left perinephric, retroperitoneal and subcutaneous flank edema attributed to recent procedure.  Original Report Authenticated By: Gerrianne Scale, M.D.   Ir Perc Nephrostomy  Left  04/03/2011  *RADIOLOGY REPORT*  Clinical history:41 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 Scheduled for percutaneous nephrolithotomy procedure.  PROCEDURE(S): ULTRASOUND AND FLUOROSCOPIC GUIDED LEFT PERCUTANEOUS NEPHROSTOMY TUBE; DILATATION OF PERCUTANEOUS NEPHROSTOMY TUBE FOR THE NEPHROLITHOTOMY PROCEDURE.; PLACEMENT OF AN ANTEGRADE URETERAL  STENT  Physician: Rachelle Hora. Henn, MD  Medications:Versed 4 mg, Fentanyl 200 mcg. A radiology nurse monitored the Kristy Wallace for moderate sedation.  Ciprofloxacin was given within two hours of incision.  Moderate sedation time:20 minutes  Fluoroscopy time: 3.5 minutes  Procedure:The procedure was explained to the Kristy Wallace.  The risks and benefits of the procedure were discussed and the Kristy Wallace's questions were addressed.  Informed consent was obtained from the Kristy Wallace.  The Kristy Wallace was placed prone.  The left flank was prepped and draped in a sterile fashion.  Ultrasound demonstrated mild dilatation of the lower pole collecting system.  The skin was anesthetized with lidocaine.  Using ultrasound guidance, a 22 gauge needle was directed into the lower pole calix.  Contrast was injected to confirm placement in the renal collecting system.   A 0.018 wire and dilator set was placed.  A 5-French Kumpe catheter was advanced into the collecting system and a Glidewire was advanced into the left ureter and urinary bladder.  Catheter was advanced into the urinary bladder.  Catheter was sutured to the skin.  The second portion of the procedure was performed in the operating room with Dr. Isabel Caprice. The Kristy Wallace was under general anesthesia. The left flank was prepped and draped in a sterile fashion.  The suture was removed.  An Amplatz wire was placed and the 5-French catheter was removed.  A peel-away sheath was placed.  A second Amplatz wire was advanced into the urinary bladder.  The tract was dilated with a 10 mm balloon.  Trocar was advanced over the balloon and into the lower pole collecting system.  Stone removal was performed by Dr. Isabel Caprice.  At the end of the procedure, an antegrade stent was needed.  Attempted to place an 8 French x 26 cm stent but the stent would not easily advanced beyond the ureterovesical junction.  As a result, 7 French x 26 cm stent was placed.  The distal aspect was reconstituted in the urinary bladder.   Proximal aspect reconstituted in the renal pelvis.  A large bore nephrostomy tube was placed within the renal collecting system.  Findings:Large staghorn calculus involving the left kidney upper pole.  Mild dilatation of the lower pole collecting system by ultrasound.  Access was obtained from the lower pole calix.  Tract was successfully dilated in the operating room and approximately 80% of the stone burden was removed based on fluoroscopy.  The proximal aspect of the ureter stent is at the UPJ and the distal aspect appears to be within the urinary bladder.  The nephrostomy tube is in the renal pelvis.  Irregularity of the upper pole collecting system consistent with recent stone removal.  Complications: None  Impression:Successful placement of a left percutaneous nephrostomy tube for a nephrolithotomy procedure.  Approximately 80% of the stone burden was removed.  Successful placement of an antegrade ureteral stent.  Original Report Authenticated By: Richarda Overlie, M.D.   Ir Dil Ureter Left  04/03/2011  *RADIOLOGY REPORT*  Clinical history:40 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 Scheduled for percutaneous nephrolithotomy procedure.  PROCEDURE(S): ULTRASOUND AND FLUOROSCOPIC GUIDED LEFT PERCUTANEOUS NEPHROSTOMY TUBE; DILATATION OF PERCUTANEOUS NEPHROSTOMY TUBE FOR THE NEPHROLITHOTOMY PROCEDURE.; PLACEMENT OF AN ANTEGRADE URETERAL STENT  Physician: Rachelle Hora. Lowella Dandy, MD  Medications:Versed  4 mg, Fentanyl 200 mcg. A radiology nurse monitored the Kristy Wallace for moderate sedation.  Ciprofloxacin was given within two hours of incision.  Moderate sedation time:20 minutes  Fluoroscopy time: 3.5 minutes  Procedure:The procedure was explained to the Kristy Wallace.  The risks and benefits of the procedure were discussed and the Kristy Wallace's questions were addressed.  Informed consent was obtained from the Kristy Wallace.  The Kristy Wallace was placed prone.  The left flank was prepped and draped in a sterile fashion.  Ultrasound demonstrated mild  dilatation of the lower pole collecting system.  The skin was anesthetized with lidocaine.  Using ultrasound guidance, a 22 gauge needle was directed into the lower pole calix.  Contrast was injected to confirm placement in the renal collecting system.   A 0.018 wire and dilator set was placed.  A 5-French Kumpe catheter was advanced into the collecting system and a Glidewire was advanced into the left ureter and urinary bladder.  Catheter was advanced into the urinary bladder.  Catheter was sutured to the skin.  The second portion of the procedure was performed in the operating room with Dr. Isabel Caprice. The Kristy Wallace was under general anesthesia. The left flank was prepped and draped in a sterile fashion.  The suture was removed.  An Amplatz wire was placed and the 5-French catheter was removed.  A peel-away sheath was placed.  A second Amplatz wire was advanced into the urinary bladder.  The tract was dilated with a 10 mm balloon.  Trocar was advanced over the balloon and into the lower pole collecting system.  Stone removal was performed by Dr. Isabel Caprice.  At the end of the procedure, an antegrade stent was needed.  Attempted to place an 8 French x 26 cm stent but the stent would not easily advanced beyond the ureterovesical junction.  As a result, 7 French x 26 cm stent was placed.  The distal aspect was reconstituted in the urinary bladder.  Proximal aspect reconstituted in the renal pelvis.  A large bore nephrostomy tube was placed within the renal collecting system.  Findings:Large staghorn calculus involving the left kidney upper pole.  Mild dilatation of the lower pole collecting system by ultrasound.  Access was obtained from the lower pole calix.  Tract was successfully dilated in the operating room and approximately 80% of the stone burden was removed based on fluoroscopy.  The proximal aspect of the ureter stent is at the UPJ and the distal aspect appears to be within the urinary bladder.  The nephrostomy tube  is in the renal pelvis.  Irregularity of the upper pole collecting system consistent with recent stone removal.  Complications: None  Impression:Successful placement of a left percutaneous nephrostomy tube for a nephrolithotomy procedure.  Approximately 80% of the stone burden was removed.  Successful placement of an antegrade ureteral stent.  Original Report Authenticated By: Richarda Overlie, M.D.   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 Scheduled for percutaneous nephrolithotomy procedure.  PROCEDURE(S): ULTRASOUND AND FLUOROSCOPIC GUIDED LEFT PERCUTANEOUS NEPHROSTOMY TUBE; DILATATION OF PERCUTANEOUS NEPHROSTOMY TUBE FOR THE NEPHROLITHOTOMY PROCEDURE.; PLACEMENT OF AN ANTEGRADE URETERAL STENT  Physician: Rachelle Hora. Henn, MD  Medications:Versed 4 mg, Fentanyl 200 mcg. A radiology nurse monitored the Kristy Wallace for moderate sedation.  Ciprofloxacin was given within two hours of incision.  Moderate sedation time:20 minutes  Fluoroscopy time: 3.5 minutes  Procedure:The procedure was explained to the Kristy Wallace.  The risks and benefits of the procedure were discussed  and the Kristy Wallace's questions were addressed.  Informed consent was obtained from the Kristy Wallace.  The Kristy Wallace was placed prone.  The left flank was prepped and draped in a sterile fashion.  Ultrasound demonstrated mild dilatation of the lower pole collecting system.  The skin was anesthetized with lidocaine.  Using ultrasound guidance, a 22 gauge needle was directed into the lower pole calix.  Contrast was injected to confirm placement in the renal collecting system.   A 0.018 wire and dilator set was placed.  A 5-French Kumpe catheter was advanced into the collecting system and a Glidewire was advanced into the left ureter and urinary bladder.  Catheter was advanced into the urinary bladder.  Catheter was sutured to the skin.  The second portion of the procedure was performed in the  operating room with Dr. Isabel Caprice. The Kristy Wallace was under general anesthesia. The left flank was prepped and draped in a sterile fashion.  The suture was removed.  An Amplatz wire was placed and the 5-French catheter was removed.  A peel-away sheath was placed.  A second Amplatz wire was advanced into the urinary bladder.  The tract was dilated with a 10 mm balloon.  Trocar was advanced over the balloon and into the lower pole collecting system.  Stone removal was performed by Dr. Isabel Caprice.  At the end of the procedure, an antegrade stent was needed.  Attempted to place an 8 French x 26 cm stent but the stent would not easily advanced beyond the ureterovesical junction.  As a result, 7 French x 26 cm stent was placed.  The distal aspect was reconstituted in the urinary bladder.  Proximal aspect reconstituted in the renal pelvis.  A large bore nephrostomy tube was placed within the renal collecting system.  Findings:Large staghorn calculus involving the left kidney upper pole.  Mild dilatation of the lower pole collecting system by ultrasound.  Access was obtained from the lower pole calix.  Tract was successfully dilated in the operating room and approximately 80% of the stone burden was removed based on fluoroscopy.  The proximal aspect of the ureter stent is at the UPJ and the distal aspect appears to be within the urinary bladder.  The nephrostomy tube is in the renal pelvis.  Irregularity of the upper pole collecting system consistent with recent stone removal.  Complications: None  Impression:Successful placement of a left percutaneous nephrostomy tube for a nephrolithotomy procedure.  Approximately 80% of the stone burden was removed.  Successful placement of an antegrade ureteral stent.  Original Report Authenticated By: Richarda Overlie, M.D.   Dg C-arm 61-120 Min-no Report  04/03/2011  CLINICAL DATA: intra op   C-ARM 61-120 MINUTES  Fluoroscopy was utilized by the requesting physician.  No radiographic   interpretation.      Assessment/Plan:    Continues to have moderate discomfort. This is undoubtedly secondary the nephrostomy tube stent. A like to see how Kristy Wallace transitions to oral pain medicines. Hopefully we will discharge her later today. Kristy Wallace will need a second look procedure in 2-3 weeks. We will plan on setting her home with the nephrostomy tube to gravity drainage.   LOS: 2 days   Jailen Lung S 04/05/2011, 8:26 AM

## 2011-04-06 ENCOUNTER — Emergency Department (HOSPITAL_COMMUNITY): Payer: Medicaid Other

## 2011-04-06 ENCOUNTER — Encounter (HOSPITAL_COMMUNITY): Payer: Self-pay | Admitting: Emergency Medicine

## 2011-04-06 ENCOUNTER — Emergency Department (HOSPITAL_COMMUNITY)
Admission: EM | Admit: 2011-04-06 | Discharge: 2011-04-06 | Disposition: A | Discharge status: Home / Self Care | Payer: Medicaid Other | Attending: Emergency Medicine | Admitting: Emergency Medicine

## 2011-04-06 DIAGNOSIS — R109 Unspecified abdominal pain: Secondary | ICD-10-CM | POA: Insufficient documentation

## 2011-04-06 DIAGNOSIS — K5900 Constipation, unspecified: Secondary | ICD-10-CM

## 2011-04-06 DIAGNOSIS — N200 Calculus of kidney: Secondary | ICD-10-CM | POA: Insufficient documentation

## 2011-04-06 DIAGNOSIS — Z9889 Other specified postprocedural states: Secondary | ICD-10-CM | POA: Insufficient documentation

## 2011-04-06 LAB — URINALYSIS, ROUTINE W REFLEX MICROSCOPIC
Bilirubin Urine: NEGATIVE
Nitrite: NEGATIVE
Protein, ur: 100 mg/dL — AB
Urobilinogen, UA: 0.2 mg/dL (ref 0.0–1.0)

## 2011-04-06 LAB — POCT I-STAT, CHEM 8
Calcium, Ion: 1.17 mmol/L (ref 1.12–1.32)
Creatinine, Ser: 0.7 mg/dL (ref 0.50–1.10)
Glucose, Bld: 94 mg/dL (ref 70–99)
HCT: 35 % — ABNORMAL LOW (ref 36.0–46.0)
Hemoglobin: 11.9 g/dL — ABNORMAL LOW (ref 12.0–15.0)
TCO2: 26 mmol/L (ref 0–100)

## 2011-04-06 LAB — URINE MICROSCOPIC-ADD ON

## 2011-04-06 MED ORDER — METHOCARBAMOL 500 MG PO TABS: 1000.0000 mg | ORAL_TABLET | Freq: Four times a day (QID) | ORAL | Status: AC

## 2011-04-06 NOTE — ED Provider Notes (Signed)
History     CSN: [id number]  Arrival date & time 04/06/11  4098   First MD Initiated Contact with Patient 04/06/11 [phone]      Chief Complaint  Patient presents with  . Flank Pain    S/p sx 04/03/11  on lt kidney to remove stone  stent and tube place now c/o pain at site had an appt for 04/07/11 but pain has increased     (Consider location/radiation/quality/duration/timing/severity/associated sxs/prior treatment) HPI  Patient had a large kidney stone on the left and in 3 days ago she had a stent and percutaneous nephrostomy placed by Dr. Isabel Caprice. Dr. Lowella Dandy. She was discharged yesterday about 5 PM. She states she has not had a bowel movement since the day before her surgery. This morning after going to the bathroom she had felt fine and she was fed and had some burning pain around where the nephrostomy tube enters her flank that lasted 15-20 minutes. She said it was a spasm-like pain. She states the pain is gone now. She denies nausea, vomiting, fever.  The c trimethoprim and for 30 days prior to her procedure and when she was discharged from the hospital yesterday she was not sent home on antibiotics.Her concern was that she was on antibiotics for 30 days prior to her surgery but she was not sent home on antibiotics.     Past Medical History  Diagnosis Date  . Asthma     exercise induced asthma  . Bladder infection     hx of bladder infection  . Frequent urination at night   . HPV (human papilloma virus) infection     Past Surgical History  Procedure Date  . Dilation and curettage of uterus 1993  . Tubal ligation 2003  . Colposcopy 2011  . Nephrolithotomy 04/03/2011    Procedure: NEPHROLITHOTOMY PERCUTANEOUS;  Surgeon: Valetta Fuller, MD;  Location: [facility];  Service: Urology;  Laterality: Left;        No family history on file.  History  Substance Use Topics  . Smoking status: Current Everyday Smoker -- 0.5 packs/day for 12 years    Types: Cigarettes  . Smokeless tobacco:  Never Used  . Alcohol Use: No   lives with fianc   OB History    Grav Para Term Preterm Abortions TAB SAB Ect Mult Living                  Review of Systems  All other systems reviewed and are negative.    Allergies  Advair diskus and Azithromycin  Home Medications   Current Outpatient Rx  Name Route Sig Dispense Refill  . ALBUTEROL SULFATE HFA 108 (90 BASE) MCG/ACT IN AERS Inhalation Inhale 2 puffs into the lungs every 6 (six) hours as needed. For exercise induced asthma.    . OXYBUTYNIN CHLORIDE 5 MG PO TABS Oral Take 2 tablets (10 mg total) by mouth every 8 (eight) hours as needed. 20 tablet 2  . OXYCODONE-ACETAMINOPHEN 5-325 MG PO TABS Oral Take 1-2 tablets by mouth every 4 (four) hours as needed. 30 tablet 0  . TRIMETHOPRIM 100 MG PO TABS Oral Take 100 mg by mouth daily. Completed on Sunday 04-02-11      BP 124/71  Pulse 81  Temp(Src) 98.8 F (37.1 C) (Oral)  Resp 18  SpO2 99%  LMP 03/02/2011  Vital signs normal    Physical Exam  Nursing note and vitals reviewed. Constitutional: She is oriented to person, place, and time. She appears  well-developed and well-nourished.  Non-toxic appearance. She does not appear ill. No distress.  HENT:  Head: Normocephalic and atraumatic.  Right Ear: External ear normal.  Left Ear: External ear normal.  Nose: Nose normal. No mucosal edema or rhinorrhea.  Mouth/Throat: Oropharynx is clear and moist and mucous membranes are normal. No dental abscesses or uvula swelling.  Eyes: Conjunctivae and EOM are normal. Pupils are equal, round, and reactive to light.  Neck: Normal range of motion and full passive range of motion without pain. Neck supple.  Cardiovascular: Normal rate, regular rhythm and normal heart sounds.  Exam reveals no gallop and no friction rub.   No murmur heard. Pulmonary/Chest: Effort normal and breath sounds normal. No respiratory distress. She has no wheezes. She has no rhonchi. She has no rales. She exhibits no  tenderness and no crepitus.  Abdominal: Soft. Normal appearance and bowel sounds are normal. She exhibits no distension. There is no tenderness. There is no rebound and no guarding.  Genitourinary:       Patient has a large dressing on her left flank. The dressing is dry without drainage. There is a small area of redness on the lateral inferior portion underneath the clear dressing that is nontender to palpation. There are no open lesions or vesicles seen.  Musculoskeletal: Normal range of motion. She exhibits no edema and no tenderness.       Moves all extremities well.   Neurological: She is alert and oriented to person, place, and time. She has normal strength. No cranial nerve deficit.  Skin: Skin is warm, dry and intact. No rash noted. No erythema. No pallor.  Psychiatric: She has a normal mood and affect. Her speech is normal and behavior is normal. Her mood appears not anxious.    ED Course  Procedures (including critical care time)  11:20 Dr Isabel Caprice states he doesn't want her to continue on antibiotics until she has her second surgery. She has an appt to see him tomorrow at 2 pm.   Results for orders placed during the hospital encounter of 04/06/11  URINALYSIS, ROUTINE W REFLEX MICROSCOPIC      Component Value Range   Color, Urine YELLOW  YELLOW    APPearance CLOUDY (*) CLEAR    Specific Gravity, Urine 1.009  1.005 - 1.030    pH 8.0  5.0 - 8.0    Glucose, UA NEGATIVE  NEGATIVE (mg/dL)   Hgb urine dipstick LARGE (*) NEGATIVE    Bilirubin Urine NEGATIVE  NEGATIVE    Ketones, ur TRACE (*) NEGATIVE (mg/dL)   Protein, ur 161 (*) NEGATIVE (mg/dL)   Urobilinogen, UA 0.2  0.0 - 1.0 (mg/dL)   Nitrite NEGATIVE  NEGATIVE    Leukocytes, UA LARGE (*) NEGATIVE   POCT I-STAT, CHEM 8      Component Value Range   Sodium 141  135 - 145 (mEq/L)   Potassium 3.4 (*) 3.5 - 5.1 (mEq/L)   Chloride 104  96 - 112 (mEq/L)   BUN <3 (*) 6 - 23 (mg/dL)   Creatinine, Ser 0.96  0.50 - 1.10 (mg/dL)    Glucose, Bld 94  70 - 99 (mg/dL)   Calcium, Ion 0.45  4.09 - 1.32 (mmol/L)   TCO2 26  0 - 100 (mmol/L)   Hemoglobin 11.9 (*) 12.0 - 15.0 (g/dL)   HCT 81.1 (*) 91.4 - 46.0 (%)  URINE MICROSCOPIC-ADD ON      Component Value Range   Squamous Epithelial / LPF FEW (*) RARE  WBC, UA TOO NUMEROUS TO COUNT  <3 (WBC/hpf)   RBC / HPF TOO NUMEROUS TO COUNT  <3 (RBC/hpf)   Bacteria, UA FEW (*) RARE    Laboratory interpretation all normal except hematuria and pyuria   Dg Abd 1 View  04/06/2011  *RADIOLOGY REPORT*  Clinical Data: Left renal stone with recent stent placement. Severe left flank pain and constipation.  ABDOMEN - 1 VIEW  Comparison: CT abdomen pelvis 04/04/2011.  Findings: A double J left ureteral stent is in place, with the proximal portion formed in the expected location of the left renal pelvis and distal loop formed in the bladder.  Percutaneous nephrostomy tube is in place on the left.  Large calcifications are again seen projecting over the upper pole left kidney.  No definite calcifications project along the course of the stent or bladder.  A fair amount of stool is seen in the cecum as well as ascending and rectosigmoid colon.  IMPRESSION:  1.  Left nephrolithiasis with nephrostomy tube and ureteral stent in place. 2.  Constipation.  Original Report Authenticated By: Reyes Ivan, M.D.     Diagnoses that have been ruled out:  None  Diagnoses that are still under consideration:  None  Final diagnoses:  Left flank pain   New Prescriptions   METHOCARBAMOL (ROBAXIN) 500 MG TABLET    Take 2 tablets (1,000 mg total) by mouth 4 (four) times daily.   Plan discharge  Devoria Albe, MD, FACEP   MDM          Ward Givens, MD 04/06/11 [id number]

## 2011-04-06 NOTE — ED Notes (Signed)
ZOX:[id number]<AV> Expected date:04/06/11<BR> Expected time: 9:09 AM<BR> Means of arrival:Ambulance<BR> Comments:<BR> Post op pain

## 2011-04-10 ENCOUNTER — Other Ambulatory Visit: Payer: Self-pay | Admitting: Urology

## 2011-04-11 NOTE — Discharge Summary (Signed)
Physician Discharge Summary  Patient ID: Kristy Wallace MRN: [id number] DOB/AGE: 04-15-1970 41 y.o.  Admit date: 04/03/2011 Discharge date: 04/05/11  Admission Diagnoses:Staghorn urinary calculus  Discharge Diagnoses: same Active Problems:  * No active hospital problems. *    Discharged Condition: good  Hospital Course: The patient underwent a percutaneous nephrolithotomy procedure. The surgery itself was uneventful. She had a very large partial staghorn stone. We were able to debulk a proximally 75-80% of the stone. A double-J stent was placed as well as nephrostomy tube.The patient had no obvious postoperative complications. CT scan done on hospital day #2 demonstrated residual stone in the extreme upper pole. No other complications or problems were noted. She was kept an additional day primarily for pain management.she was discharged on postoperative day #2 with the nephrostomy tube to gravity drainage.  Consults: None  Significant Diagnostic Studies: CT scan  Treatments: surgery: percutaneous nephrolithotomy performed on 1/28/ 2013  Discharge Exam: Blood pressure 122/79, pulse 79, temperature 98.3 F (36.8 C), temperature source Oral, resp. rate 18, height 5\' 7"  (1.702 m), weight 65.318 kg (144 lb), SpO2 100.00%. General appearance: alert and cooperative Respiratory: Normal effort Abdomen: Soft and nontender with unremarkable nephrostomy tube site  Disposition: Home or Self Care   Medication List  As of 04/11/2011  5:53 PM   STOP taking these medications         HYDROcodone-acetaminophen 5-500 MG per tablet         TAKE these medications         albuterol 108 (90 BASE) MCG/ACT inhaler   Commonly known as: PROVENTIL HFA;VENTOLIN HFA   Inhale 2 puffs into the lungs every 6 (six) hours as needed. For exercise induced asthma.      oxybutynin 5 MG tablet   Commonly known as: DITROPAN   Take 2 tablets (10 mg total) by mouth every 8 (eight) hours as needed.     oxyCODONE-acetaminophen 5-325 MG per tablet   Commonly known as: PERCOCET   Take 1-2 tablets by mouth every 4 (four) hours as needed.      trimethoprim 100 MG tablet   Commonly known as: TRIMPEX   Take 100 mg by mouth daily. Completed on Sunday 04-02-11             Signed: Aleana Fifita S 04/11/2011, 5:53 PM

## 2011-04-17 ENCOUNTER — Encounter (HOSPITAL_COMMUNITY): Payer: Self-pay | Admitting: *Deleted

## 2011-04-18 ENCOUNTER — Encounter (HOSPITAL_COMMUNITY): Payer: Self-pay | Admitting: Pharmacy Technician

## 2011-04-21 NOTE — H&P (View-Only) (Signed)
Urology Admission H&P  Chief Complaint: Staghorn stone left kidney.  History of Present Illness:  Kristy Wallace presents today along with her fiance to further discuss her recently diagnosed staghorn left-sided renal calculus. Apparently she was unable to get any care in [place] and therefore was referred to [facility]. She has had 4-6 months of at least some degree of left-sided back pain but her problems have really intensified the last 2 months. The patient was diagnosed eventually with a staghorn stone involving the left kidney. Urine here last time did show some pyuria but her urine culture was negative. I suspect she will have some chronic pyuria simply based on inflammation from the stone. Approximately a month ago she did however have a febrile illness and probably did have some concurrent pyelonephritis. The patient had a KUB here several days ago which I have reviewed and she did bring the CT disk from [place] today. She has a very large 6 cm in length staghorn stone which appears to extend from the upper pole calyceal system down into the renal pelvis and probably near the ureteropelvic junction. They are here to discuss treatment options for this.       History of Present Illness            41 YO female seen today as a referral from Dr. Ricke Hey for evaluation of left staghorn renal calculus with mild left hydronephrosis seen on CTU 02/11/11. Has had chronic flank/back pain since 6/12. Recently seen in the ER for gross hematuria and flank pain. H/O nephrolithasis in 2003 with pregnancy which she was able to spontanously pass. Since that time has had not further stones. Developed fever 102 a month ago and was treated for ? UTI/pyelonephritis.   Interval HX: Today denies any further fever. Denies hematuria or passage of stone material. Continues to have nagging left flank pain that is controlled with Hydrocodone 5/500mg  PRN.    Past Medical History  Diagnosis Date  . Asthma    exercise induced asthma  . Bladder infection     hx of bladder infection  . Frequent urination at night   . HPV (human papilloma virus) infection    Past Surgical History  Procedure Date  . Dilation and curettage of uterus 1993  . Tubal ligation 2003  . Colposcopy 2011    Home Medications:  No prescriptions prior to admission   Allergies:  Allergies  Allergen Reactions  . Advair Diskus (Fluticasone-Salmeterol) Other (See Comments)    Flu-like symptoms.  . Azithromycin Other (See Comments)    dizziness    No family history on file. Social History:  reports that she has been smoking Cigarettes.  She has a 6 pack-year smoking history. She has never used smokeless tobacco. She reports that she uses illicit drugs (Marijuana). She reports that she does not drink alcohol.  Review of Systems  Constitutional: Positive for malaise/fatigue. Negative for fever.  HENT: Negative.   Cardiovascular: Negative.   Gastrointestinal: Positive for abdominal pain. Negative for vomiting.  Genitourinary: Positive for frequency and flank pain.  Musculoskeletal: Negative.   Neurological: Negative.   Endo/Heme/Allergies: Negative.   Psychiatric/Behavioral: Negative.     Physical Exam:  Vital signs in last 24 hours:   Physical Exam  Constitutional: She is oriented to person, place, and time. She appears well-developed and well-nourished.  Cardiovascular: Normal rate and regular rhythm.   Respiratory: Effort normal.  GI: Soft. She exhibits no distension and no mass. There is no tenderness.  Musculoskeletal: Normal  range of motion.  Neurological: She is alert and oriented to person, place, and time.  Skin: Skin is warm and dry.  Psychiatric: She has a normal mood and affect. Her behavior is normal.    Laboratory Data:  No results found for this or any previous visit (from the past 24 hour(Wallace)). Recent Results (from the past 240 hour(Wallace))  SURGICAL PCR SCREEN     Status: Normal   Collection  Time   03/30/11  8:48 AM      Component Value Range Status Comment   MRSA, PCR NEGATIVE  NEGATIVE  Final    Staphylococcus aureus NEGATIVE  NEGATIVE  Final    Creatinine: No results found for this basename: CREATININE:7 in the last 168 hours Baseline Creatinine:   Impression/Assessment:  Left staghorn calculus.  Plan:  Kristy Wallace has a substantial staghorn stone emanating from the upper pole of the left kidney extending through the renal pelvis and probably down into the proximal ureter, certainly into the UPJ region. She has had some intermittent pain now for at least a couple of months and has had at least one episode of what sounds like acute pyelonephritis. At this point there is no urgent need for intervention, but clearly she needs to have this stone definitively managed. Fortunately, the renal parenchyma appears preserved and she does not appear to have a substantial degree of hydronephrosis. Clearly the stone is not amenable to lithotripsy or ureteroscopy. Open procedures are an option but clearly a percutaneous nephrolithotomy would be the treatment of choice. It is possible that multiple access would be necessary, but hopefully through a lower pole access we would be able to take care of the stone within the renal pelvis and extending into the upper pole. Obviously the possibility exists that she would need a second look procedure or potentially a lithotripsy after the percutaneous procedure. Ideally we would render her stone free, but it may be more practical to really expect substantial debulking of the stone with potential additional therapy necessary to render her completely stone free. We talked about the surgery itself, the advantages as well as the risks. I would anticipate at least an overnight stay in the hospital and potentially a longer hospitalization depending on how she did clinically. We will attempt to get her on the schedule for some time in the next availability and provide the  disk to the interventional radiologist.   Kristy Wallace 03/31/2011, 11:36 AM

## 2011-04-21 NOTE — Interval H&P Note (Signed)
History and Physical Interval Note:  04/21/2011 8:19 AM  Kristy Wallace  has presented today for surgery, with the diagnosis of Left Staghorn Stone. She underwent a successful debulking percutaneous nephrolithotomy several weeks ago. He felt that we were able to remove 70-80% of the stone. She does have a continued presence of a nephrostomy catheter. We discussed with her next treatment options and felt that a second look procedure with flexible instrumentation made the most sense. The various methods of treatment have been discussed with the patient and family. After consideration of risks, benefits and other options for treatment, the patient has consented to  Procedure(s) (LRB): NEPHROLITHOTOMY PERCUTANEOUS SECOND LOOK (Left) CYSTOSCOPY FLEXIBLE (N/A) HOLMIUM LASER APPLICATION (Left) as a surgical intervention .  The patients' history has been reviewed, patient examined, no change in status, stable for surgery.  I have reviewed the patients' chart and labs.  Questions were answered to the patient's satisfaction.     Broderick Fonseca S

## 2011-04-24 ENCOUNTER — Ambulatory Visit (HOSPITAL_COMMUNITY): Payer: Medicaid Other

## 2011-04-24 ENCOUNTER — Ambulatory Visit (HOSPITAL_COMMUNITY): Payer: Medicaid Other | Admitting: Anesthesiology

## 2011-04-24 ENCOUNTER — Encounter (HOSPITAL_COMMUNITY): Payer: Self-pay | Admitting: Anesthesiology

## 2011-04-24 ENCOUNTER — Encounter (HOSPITAL_COMMUNITY)
Admission: RE | Disposition: A | Discharge status: Home / Self Care | Payer: Self-pay | Source: Ambulatory Visit | Attending: Urology

## 2011-04-24 ENCOUNTER — Ambulatory Visit (HOSPITAL_COMMUNITY)
Admission: RE | Admit: 2011-04-24 | Discharge: 2011-04-25 | Disposition: A | Discharge status: Home / Self Care | Payer: Medicaid Other | Source: Ambulatory Visit | Attending: Urology | Admitting: Urology

## 2011-04-24 ENCOUNTER — Encounter (HOSPITAL_COMMUNITY): Payer: Self-pay

## 2011-04-24 DIAGNOSIS — R350 Frequency of micturition: Secondary | ICD-10-CM | POA: Insufficient documentation

## 2011-04-24 DIAGNOSIS — R5383 Other fatigue: Secondary | ICD-10-CM | POA: Insufficient documentation

## 2011-04-24 DIAGNOSIS — R5381 Other malaise: Secondary | ICD-10-CM | POA: Insufficient documentation

## 2011-04-24 DIAGNOSIS — R109 Unspecified abdominal pain: Secondary | ICD-10-CM | POA: Insufficient documentation

## 2011-04-24 DIAGNOSIS — N200 Calculus of kidney: Secondary | ICD-10-CM | POA: Insufficient documentation

## 2011-04-24 HISTORY — DX: Calculus of kidney: N20.0

## 2011-04-24 HISTORY — PX: NEPHROLITHOTOMY: SHX5134

## 2011-04-24 LAB — BASIC METABOLIC PANEL
BUN: 7 mg/dL (ref 6–23)
CO2: 26 mEq/L (ref 19–32)
Chloride: 103 mEq/L (ref 96–112)
GFR calc Af Amer: >90 mL/min (ref >90)
Potassium: 4 mEq/L (ref 3.5–5.1)

## 2011-04-24 LAB — CBC
HCT: 34.3 % — ABNORMAL LOW (ref 36.0–46.0)
Hemoglobin: 11.4 g/dL — ABNORMAL LOW (ref 12.0–15.0)
MCV: 91 fL (ref 78.0–100.0)
RBC: 3.77 MIL/uL — ABNORMAL LOW (ref 3.87–5.11)
RDW: 13.5 % (ref 11.5–15.5)
WBC: 6.6 10*3/uL (ref 4.0–10.5)

## 2011-04-24 LAB — HCG, SERUM, QUALITATIVE: Preg, Serum: NEGATIVE

## 2011-04-24 LAB — SURGICAL PCR SCREEN: Staphylococcus aureus: POSITIVE — AB

## 2011-04-24 SURGERY — NEPHROLITHOTOMY PERCUTANEOUS SECOND LOOK
Anesthesia: General | Site: Flank | Laterality: Left | Wound class: Clean Contaminated

## 2011-04-24 MED ORDER — HYDROMORPHONE HCL PF 1 MG/ML IJ SOLN
0.2500 mg | INTRAMUSCULAR | Status: DC | PRN
  Administered 2011-04-24 (×3): 0.5 mg via INTRAVENOUS

## 2011-04-24 MED ORDER — HYDROMORPHONE HCL PF 1 MG/ML IJ SOLN
INTRAMUSCULAR | Status: AC
  Filled 2011-04-24: qty 1

## 2011-04-24 MED ORDER — DEXTROSE 5 % IV SOLN
1.0000 g | INTRAVENOUS | Status: DC
  Administered 2011-04-24: 1 g via INTRAVENOUS
  Filled 2011-04-24 (×2): qty 10

## 2011-04-24 MED ORDER — BELLADONNA ALKALOIDS-OPIUM 16.2-60 MG RE SUPP
1.0000 | Freq: Four times a day (QID) | RECTAL | Status: DC | PRN
  Administered 2011-04-24: 1 via RECTAL
  Filled 2011-04-24: qty 1

## 2011-04-24 MED ORDER — FENTANYL CITRATE 0.05 MG/ML IJ SOLN
INTRAMUSCULAR | Status: DC | PRN
  Administered 2011-04-24 (×2): 100 ug via INTRAVENOUS
  Administered 2011-04-24: 50 ug via INTRAVENOUS
  Administered 2011-04-24: 100 ug via INTRAVENOUS
  Administered 2011-04-24: 50 ug via INTRAVENOUS

## 2011-04-24 MED ORDER — KCL IN DEXTROSE-NACL 20-5-0.45 MEQ/L-%-% IV SOLN
INTRAVENOUS | Status: DC
  Administered 2011-04-24: 100 mL/h via INTRAVENOUS
  Administered 2011-04-24 – 2011-04-25 (×2): via INTRAVENOUS
  Filled 2011-04-24 (×5): qty 1000

## 2011-04-24 MED ORDER — GENTAMICIN IN SALINE 1.6-0.9 MG/ML-% IV SOLN
80.0000 mg | INTRAVENOUS | Status: AC
  Administered 2011-04-24: 80 mg via INTRAVENOUS

## 2011-04-24 MED ORDER — CEFAZOLIN SODIUM 1-5 GM-% IV SOLN
INTRAVENOUS | Status: AC
  Filled 2011-04-24: qty 50

## 2011-04-24 MED ORDER — ACETAMINOPHEN 10 MG/ML IV SOLN
INTRAVENOUS | Status: DC | PRN
  Administered 2011-04-24: 1000 mg via INTRAVENOUS

## 2011-04-24 MED ORDER — ALBUTEROL SULFATE HFA 108 (90 BASE) MCG/ACT IN AERS
2.0000 | INHALATION_SPRAY | Freq: Four times a day (QID) | RESPIRATORY_TRACT | Status: DC | PRN
  Filled 2011-04-24: qty 6.7

## 2011-04-24 MED ORDER — SODIUM CHLORIDE 0.9 % IR SOLN
Status: DC | PRN
  Administered 2011-04-24: 11000 mL

## 2011-04-24 MED ORDER — GLYCOPYRROLATE 0.2 MG/ML IJ SOLN
INTRAMUSCULAR | Status: DC | PRN
  Administered 2011-04-24: 0.2 mg via INTRAVENOUS

## 2011-04-24 MED ORDER — ONDANSETRON HCL 4 MG/2ML IJ SOLN
INTRAMUSCULAR | Status: DC | PRN
  Administered 2011-04-24: 4 mg via INTRAVENOUS

## 2011-04-24 MED ORDER — ONDANSETRON HCL 4 MG/2ML IJ SOLN: 4.0000 mg | INTRAMUSCULAR | Status: DC | PRN

## 2011-04-24 MED ORDER — ACETAMINOPHEN 10 MG/ML IV SOLN
INTRAVENOUS | Status: AC
  Filled 2011-04-24: qty 100

## 2011-04-24 MED ORDER — LACTATED RINGERS IV SOLN
INTRAVENOUS | Status: DC
  Administered 2011-04-24: 11:00:00 via INTRAVENOUS
  Administered 2011-04-24: 1000 mL via INTRAVENOUS

## 2011-04-24 MED ORDER — CEFAZOLIN SODIUM 1-5 GM-% IV SOLN
1.0000 g | INTRAVENOUS | Status: AC
  Administered 2011-04-24: 1 g via INTRAVENOUS

## 2011-04-24 MED ORDER — PROMETHAZINE HCL 25 MG/ML IJ SOLN: 6.2500 mg | INTRAMUSCULAR | Status: DC | PRN

## 2011-04-24 MED ORDER — NEOSTIGMINE METHYLSULFATE 1 MG/ML IJ SOLN
INTRAMUSCULAR | Status: DC | PRN
  Administered 2011-04-24: 1 mg via INTRAVENOUS

## 2011-04-24 MED ORDER — LIDOCAINE HCL (CARDIAC) 20 MG/ML IV SOLN
INTRAVENOUS | Status: DC | PRN
  Administered 2011-04-24: 75 mg via INTRAVENOUS

## 2011-04-24 MED ORDER — OXYCODONE-ACETAMINOPHEN 5-325 MG PO TABS
1.0000 | ORAL_TABLET | ORAL | Status: DC | PRN
  Administered 2011-04-25: 1 via ORAL
  Filled 2011-04-24: qty 1

## 2011-04-24 MED ORDER — GENTAMICIN IN SALINE 1.6-0.9 MG/ML-% IV SOLN
INTRAVENOUS | Status: AC
  Filled 2011-04-24: qty 50

## 2011-04-24 MED ORDER — OXYBUTYNIN CHLORIDE 5 MG PO TABS
10.0000 mg | ORAL_TABLET | Freq: Three times a day (TID) | ORAL | Status: DC | PRN
  Administered 2011-04-25: 10 mg via ORAL
  Filled 2011-04-24: qty 2

## 2011-04-24 MED ORDER — HYDROMORPHONE HCL PF 1 MG/ML IJ SOLN
0.5000 mg | INTRAMUSCULAR | Status: DC | PRN
  Administered 2011-04-24: 0.5 mg via INTRAVENOUS
  Administered 2011-04-24 – 2011-04-25 (×2): 1 mg via INTRAVENOUS
  Filled 2011-04-24 (×3): qty 1

## 2011-04-24 MED ORDER — DOCUSATE SODIUM 100 MG PO CAPS
100.0000 mg | ORAL_CAPSULE | Freq: Two times a day (BID) | ORAL | Status: DC
  Administered 2011-04-24 – 2011-04-25 (×2): 100 mg via ORAL
  Filled 2011-04-24 (×3): qty 1

## 2011-04-24 MED ORDER — DEXAMETHASONE SODIUM PHOSPHATE 10 MG/ML IJ SOLN
INTRAMUSCULAR | Status: DC | PRN
  Administered 2011-04-24: 10 mg via INTRAVENOUS

## 2011-04-24 MED ORDER — IOHEXOL 300 MG/ML  SOLN
INTRAMUSCULAR | Status: DC | PRN
  Administered 2011-04-24: 10 mL

## 2011-04-24 MED ORDER — CISATRACURIUM BESYLATE 2 MG/ML IV SOLN
INTRAVENOUS | Status: DC | PRN
  Administered 2011-04-24: 10 mg via INTRAVENOUS

## 2011-04-24 MED ORDER — MIDAZOLAM HCL 5 MG/5ML IJ SOLN
INTRAMUSCULAR | Status: DC | PRN
  Administered 2011-04-24 (×2): 1 mg via INTRAVENOUS

## 2011-04-24 MED ORDER — IOHEXOL 300 MG/ML  SOLN
INTRAMUSCULAR | Status: AC
  Filled 2011-04-24: qty 2

## 2011-04-24 MED ORDER — KCL IN DEXTROSE-NACL 20-5-0.45 MEQ/L-%-% IV SOLN
INTRAVENOUS | Status: AC
  Filled 2011-04-24: qty 1000

## 2011-04-24 MED ORDER — PROPOFOL 10 MG/ML IV EMUL
INTRAVENOUS | Status: DC | PRN
  Administered 2011-04-24: 150 mg via INTRAVENOUS
  Administered 2011-04-24 (×2): 25 mg via INTRAVENOUS

## 2011-04-24 MED ORDER — METHOCARBAMOL 500 MG PO TABS: 500.0000 mg | ORAL_TABLET | Freq: Three times a day (TID) | ORAL | Status: DC | PRN

## 2011-04-24 MED ORDER — MUPIROCIN 2 % EX OINT
TOPICAL_OINTMENT | CUTANEOUS | Status: AC
  Filled 2011-04-24: qty 22

## 2011-04-24 SURGICAL SUPPLY — 55 items
ADAPTER CATH URET PLST 4-6FR (CATHETERS) ×2 IMPLANT
BAG URINE DRAINAGE (UROLOGICAL SUPPLIES) ×2 IMPLANT
BAG URO CATCHER STRL LF (DRAPE) ×2 IMPLANT
BASKET ZERO TIP NITINOL 2.4FR (BASKET) ×2 IMPLANT
BENZOIN TINCTURE PRP APPL 2/3 (GAUZE/BANDAGES/DRESSINGS) ×4 IMPLANT
CATH FOLEY 2W COUNCIL 20FR 5CC (CATHETERS) IMPLANT
CATH ROBINSON RED A/P 20FR (CATHETERS) IMPLANT
CATH URET 5FR 28IN CONE TIP (BALLOONS)
CATH URET 5FR 28IN OPEN ENDED (CATHETERS) ×2 IMPLANT
CATH URET 5FR 70CM CONE TIP (BALLOONS) IMPLANT
CATH URET WHISTLE 5FR 28IN (CATHETERS) ×2 IMPLANT
CATH URET WHISTLE 6FR (CATHETERS) IMPLANT
CATH X-FORCE N30 NEPHROSTOMY (TUBING) ×2 IMPLANT
CLOTH BEACON ORANGE TIMEOUT ST (SAFETY) ×2 IMPLANT
COVER SURGICAL LIGHT HANDLE (MISCELLANEOUS) ×4 IMPLANT
DRAPE C-ARM 42X72 X-RAY (DRAPES) ×2 IMPLANT
DRAPE CAMERA CLOSED 9X96 (DRAPES) ×2 IMPLANT
DRAPE LINGEMAN PERC (DRAPES) ×2 IMPLANT
DRAPE SURG IRRIG POUCH 19X23 (DRAPES) ×2 IMPLANT
DRSG PAD ABDOMINAL 8X10 ST (GAUZE/BANDAGES/DRESSINGS) ×2 IMPLANT
DRSG TEGADERM 8X12 (GAUZE/BANDAGES/DRESSINGS) ×2 IMPLANT
GAUZE SPONGE 4X4 12PLY STRL LF (GAUZE/BANDAGES/DRESSINGS) ×2 IMPLANT
GLOVE BIOGEL M STRL SZ7.5 (GLOVE) ×2 IMPLANT
GLOVE SURG SS PI 8.0 STRL IVOR (GLOVE) ×2 IMPLANT
GOWN PREVENTION PLUS XLARGE (GOWN DISPOSABLE) IMPLANT
GOWN STRL NON-REIN LRG LVL3 (GOWN DISPOSABLE) IMPLANT
GOWN STRL REIN XL XLG (GOWN DISPOSABLE) ×2 IMPLANT
KIT BASIN OR (CUSTOM PROCEDURE TRAY) ×2 IMPLANT
LASER FIBER DISP (UROLOGICAL SUPPLIES) ×2 IMPLANT
LASER FIBER DISP 1000U (UROLOGICAL SUPPLIES) IMPLANT
MANIFOLD NEPTUNE II (INSTRUMENTS) ×2 IMPLANT
MARKER SKIN DUAL TIP RULER LAB (MISCELLANEOUS) ×2 IMPLANT
NS IRRIG 1000ML POUR BTL (IV SOLUTION) ×2 IMPLANT
PACK BASIC VI WITH GOWN DISP (CUSTOM PROCEDURE TRAY) ×2 IMPLANT
PACK CYSTO (CUSTOM PROCEDURE TRAY) IMPLANT
PAD ABD 7.5X8 STRL (GAUZE/BANDAGES/DRESSINGS) ×4 IMPLANT
POSITIONER SURGICAL ARM (MISCELLANEOUS) ×4 IMPLANT
PROBE EHL 9 FR 470CM (MISCELLANEOUS) IMPLANT
PROBE LITHOCLAST ULTRA 3.8X403 (UROLOGICAL SUPPLIES) IMPLANT
PROBE PNEUMATIC 1.0MMX570MM (UROLOGICAL SUPPLIES) IMPLANT
SET IRRIG Y TYPE TUR BLADDER L (SET/KITS/TRAYS/PACK) ×2 IMPLANT
SET WARMING FLUID IRRIGATION (MISCELLANEOUS) IMPLANT
SPONGE GAUZE 4X4 12PLY (GAUZE/BANDAGES/DRESSINGS) ×2 IMPLANT
SPONGE LAP 4X18 X RAY DECT (DISPOSABLE) ×2 IMPLANT
STONE CATCHER W/TUBE ADAPTER (UROLOGICAL SUPPLIES) IMPLANT
SUT SILK 2 0 30  PSL (SUTURE) ×1
SUT SILK 2 0 30 PSL (SUTURE) ×1 IMPLANT
SYR 20CC LL (SYRINGE) ×4 IMPLANT
SYRINGE 10CC LL (SYRINGE) ×2 IMPLANT
TOWEL OR 17X26 10 PK STRL BLUE (TOWEL DISPOSABLE) ×2 IMPLANT
TOWEL OR NON WOVEN STRL DISP B (DISPOSABLE) ×2 IMPLANT
TRAY FOLEY CATH 14FRSI W/METER (CATHETERS) ×2 IMPLANT
TUBING CONNECTING 10 (TUBING) ×4 IMPLANT
WATER STERILE IRR 1500ML POUR (IV SOLUTION) IMPLANT
WIRE COONS/BENSON .038X145CM (WIRE) ×2 IMPLANT

## 2011-04-24 NOTE — Anesthesia Postprocedure Evaluation (Signed)
  Anesthesia Post-op Note  Patient: Kristy Wallace  Procedure(s) Performed: Procedure(s) (LRB): NEPHROLITHOTOMY PERCUTANEOUS SECOND LOOK (Left) CYSTOSCOPY FLEXIBLE (Left) HOLMIUM LASER APPLICATION (Left)  Patient Location: PACU  Anesthesia Type: General  Level of Consciousness: awake and alert   Airway and Oxygen Therapy: Patient Spontanous Breathing  Post-op Pain: mild  Post-op Assessment: Post-op Vital signs reviewed, Patient's Cardiovascular Status Stable, Respiratory Function Stable, Patent Airway and No signs of Nausea or vomiting  Post-op Vital Signs: stable  Complications: No apparent anesthesia complications

## 2011-04-24 NOTE — Progress Notes (Signed)
Patient arrived w  Foley bag to Left nephrostomy. Urine cloudy. Patient states brownish fluid w out an odor has been noted at insertion site. Guaze w ace bandage on pt C D I

## 2011-04-24 NOTE — Anesthesia Preprocedure Evaluation (Signed)
Anesthesia Evaluation  Patient identified by MRN, date of birth, ID band Patient awake    Reviewed: Allergy & Precautions, H&P , NPO status , Patient's Chart, lab work & pertinent test results  Airway Mallampati: II TM Distance: >3 FB Neck ROM: Full    Dental No notable dental hx.    Pulmonary asthma ,  clear to auscultation  Pulmonary exam normal       Cardiovascular neg cardio ROS Regular Normal    Neuro/Psych Negative Neurological ROS  Negative Psych ROS   GI/Hepatic negative GI ROS, Neg liver ROS,   Endo/Other  Negative Endocrine ROS  Renal/GU negative Renal ROS  Genitourinary negative   Musculoskeletal negative musculoskeletal ROS (+)   Abdominal   Peds negative pediatric ROS (+)  Hematology negative hematology ROS (+)   Anesthesia Other Findings   Reproductive/Obstetrics negative OB ROS                           Anesthesia Physical Anesthesia Plan  ASA: II  Anesthesia Plan: General   Post-op Pain Management:    Induction: Intravenous  Airway Management Planned: Oral ETT  Additional Equipment:   Intra-op Plan:   Post-operative Plan: Extubation in OR  Informed Consent: I have reviewed the patients History and Physical, chart, labs and discussed the procedure including the risks, benefits and alternatives for the proposed anesthesia with the patient or authorized representative who has indicated his/her understanding and acceptance.   Dental advisory given  Plan Discussed with: CRNA  Anesthesia Plan Comments:         Anesthesia Quick Evaluation

## 2011-04-24 NOTE — Transfer of Care (Signed)
Immediate Anesthesia Transfer of Care Note  Patient: Kristy Wallace  Procedure(s) Performed: Procedure(s) (LRB): NEPHROLITHOTOMY PERCUTANEOUS SECOND LOOK (Left) CYSTOSCOPY FLEXIBLE (Left) HOLMIUM LASER APPLICATION (Left)  Patient Location: PACU  Anesthesia Type: General  Level of Consciousness: awake, oriented, sedated and patient cooperative  Airway & Oxygen Therapy: Patient Spontanous Breathing and Patient connected to face mask oxygen  Post-op Assessment: Report given to PACU RN, Post -op Vital signs reviewed and stable and Patient moving all extremities  Post vital signs: Reviewed and stable  Complications: No apparent anesthesia complications

## 2011-04-24 NOTE — Preoperative (Signed)
Beta Blockers   Reason not to administer Beta Blockers:Not Applicable 

## 2011-04-24 NOTE — Op Note (Signed)
Preoperative diagnosis: Left staghorn renal calculus status post initial percutaneous nephrolithotomy now with residual fragments in the upper pole for second look procedure Postoperative diagnosis: Same  Procedure: Second look percutaneous nephrolithotomy with flexible renoscopy M. holmium laser lithotripsy area   Surgeon: Valetta Fuller M.D.  Anesthesia: Gen.  Indications: Kristy Wallace was originally evaluated at an outside institution and found to have a extremely large staghorn stone on the left kidney. The stone measured up to 12 cm in length. She underwent a successful initial percutaneous nephrolithotomy/debulking procedure several weeks ago. We were unable to access the most distal aspect of the stone up in the extreme upper pole calyceal system she has had an indwelling nephrostomy tube and presents now for second look procedure. Additional imaging studies that suggested a 3-4 cm residual stone burden in the upper pole.     Technique and findings: Patient was brought to the operating room where she had successful induction of general endotracheal anesthesia. She was very carefully placed in the prone position and prepped and draped in usual manner. The patient received perioperative gentamicin as well as Ancef. Compression boots were placed. The nephrostomy catheter was removed. A flexible cystoscope was used to perform a flexible endoscopy. Again a large stone was encountered in the upper pole calyceal system. Holmium laser lithotripter fiber was utilized at 0.5 J and 15 Hz. This broke up the stone into numerous fragments. We then spent considerable time basket extracting the larger fragments. At the completion of the procedure there remained innumerable small 3-4 mm or smaller fragments primarily to the upper pole collecting system. We did not feel realistically that these all could be removed with the basket. No obvious larger fragments were appreciated 18 French Foley catheter was reinserted as  nephrostomy tube. With contrast injection and fluoroscopy one could see a completely intact collecting system and a fairly good amount of contrast going down what appeared to be an unobstructed ureter/ureteropelvic junction. The patient appeared to tolerate the procedure well there were no obvious complications or problems. She was brought to recovery room in stable condition.

## 2011-04-24 NOTE — Interval H&P Note (Signed)
History and Physical Interval Note:  04/24/2011 9:44 AM The patient is now status post significant debulking procedure with initial percutaneous nephrolithotomy. She has an indwelling nephrostomy tube. Kristy Wallace  has presented today for surgery, with the diagnosis of Left Staghorn Stone  The various methods of treatment have been discussed with the patient and family. After consideration of risks, benefits and other options for treatment, the patient has consented to  Procedure(s) (LRB): NEPHROLITHOTOMY PERCUTANEOUS SECOND LOOK (Left) CYSTOSCOPY FLEXIBLE (N/A) HOLMIUM LASER APPLICATION (Left) as a surgical intervention .  The patients' history has been reviewed, patient examined, no change in status, stable for surgery.  I have reviewed the patients' chart and labs.  Questions were answered to the patient's satisfaction.     Mitul Hallowell S

## 2011-04-25 ENCOUNTER — Encounter (HOSPITAL_COMMUNITY): Payer: Self-pay | Admitting: Urology

## 2011-04-25 LAB — BASIC METABOLIC PANEL
CO2: 24 mEq/L (ref 19–32)
Calcium: 9.1 mg/dL (ref 8.4–10.5)
Chloride: 104 mEq/L (ref 96–112)
Potassium: 3.8 mEq/L (ref 3.5–5.1)
Sodium: 136 mEq/L (ref 135–145)

## 2011-04-25 LAB — CBC
Hemoglobin: 10.5 g/dL — ABNORMAL LOW (ref 12.0–15.0)
MCV: 90.1 fL (ref 78.0–100.0)
Platelets: 586 10*3/uL — ABNORMAL HIGH (ref 150–400)
RBC: 3.53 MIL/uL — ABNORMAL LOW (ref 3.87–5.11)
WBC: 15.6 10*3/uL — ABNORMAL HIGH (ref 4.0–10.5)

## 2011-04-25 NOTE — Discharge Instructions (Signed)
DISCHARGE INSTRUCTIONS FOR [facility]  MEDICATIONS:  1. DO NOT RESUME YOUR IBUPROFEN, or any other medicines like aspirin, motrin, excedrin, advil, aleve, vitamin E, fish oil as these can all cause bleeding x 10 days.  2. Resume all your other meds from home - except do not take any other pain meds that you may have at home. ACTIVITY 1. No strenuous activity, sexual activity, or lifting greater than 10 pounds for 3 weeks. 2. No driving while on narcotic pain medications 3. Drink plenty of water 4. Continue to walk at home - you can still get blood clots when you are at home, so keep active, but don't over do it. 5. May return to work in 1 week (but not heavy or strenuous activity).  BATHING 1. You can shower and we recommend daily showers.  Cover your wound with a dressing and remove the dressing immediately after the shower.  Do not submerge wound under water.  WOUND CARE Your wound will drain bloody fluid and may do so for 7-14 days. You have 2 options for dressings:  1. You may use kerlex (rolled up gauze) and tape to dress your wound.  If you choose this method, then change the dressing as it becomes soaked.  Change it at least once daily until it stops draining. 2. You may use and ostomy device.  This is a bag with an andhesive circle.  The circle has a hole in the middle of it and you cut the hole to the size needed to fit the wound.  This will collect the drainage in the bag and allow you to drain the bag as needed.  SIGNS/SYMPTOMS TO CALL: 1. Please call us if you have a fever greater than 101.5, uncontrolled nausea/vomiting, uncontrolled pain, dizziness, unable to urinate, bloody urine, chest pain, shortness of breath, leg swelling, leg pain, redness around wound, drainage from wound, or any other concerns or questions. 2. You can reach [place] at [phone]. FOLLOW-UP 1.  You will have a follow up appointment 05-04-11 at 845AM

## 2011-04-25 NOTE — Discharge Summary (Signed)
Physician Discharge Summary  Patient ID: Kristy Wallace MRN: [id number] DOB/AGE: June 27, 1970 41 y.o.  Admit date: 04/24/2011 Discharge date: 04/25/2011  Admission Diagnoses:  Discharge Diagnoses:  Active Problems:  * No active hospital problems. *    Discharged Condition: good  Hospital Course: Patient was kept 23 hour observation status post her second look percutaneous nephrolithotomy procedure. She did well with no significant pain or other complications. Followup CT showed nothing of concern was residual fragments in the upper pole calyx. A plan is to discharge her with the nephrostomy tube and followup in our office for clinical reassessment.  Consults: None  Significant Diagnostic Studies: CT scan of the abdomen  Treatments: surgery: Second look percutaneous nephrolithotomy left side  Discharge Exam: Blood pressure 104/68, pulse 82, temperature 98.8 F (37.1 C), temperature source Oral, resp. rate 18, height 5\' 7"  (1.702 m), weight 64.921 kg (143 lb 2 oz), last menstrual period 04/02/2010, SpO2 97.00%. Patient is awake and alert and in no acute distress. Abdomen is benign. Nephrostomy tube site looks appropriate. Extremities unremarkable. Disposition: 01-Home or Self Care   Medication List  As of 04/25/2011  8:10 AM   TAKE these medications         albuterol 108 (90 BASE) MCG/ACT inhaler   Commonly known as: PROVENTIL HFA;VENTOLIN HFA   Inhale 2 puffs into the lungs every 6 (six) hours as needed. For exercise induced asthma.      HYDROcodone-acetaminophen 10-325 MG per tablet   Commonly known as: NORCO   Take 1 tablet by mouth every 6 (six) hours as needed. Pain        methocarbamol 500 MG tablet   Commonly known as: ROBAXIN   Take 500 mg by mouth 3 (three) times daily as needed. Muscle spasm      oxybutynin 5 MG tablet   Commonly known as: DITROPAN   Take 10 mg by mouth every 8 (eight) hours as needed. Bladder           Follow-up Information    Follow up on  05/04/2011.         Signed: Naevia Unterreiner Wallace 04/25/2011, 8:10 AM

## 2011-05-10 ENCOUNTER — Other Ambulatory Visit: Payer: Self-pay | Admitting: Urology

## 2011-05-11 ENCOUNTER — Encounter (HOSPITAL_BASED_OUTPATIENT_CLINIC_OR_DEPARTMENT_OTHER): Payer: Self-pay | Admitting: *Deleted

## 2011-05-11 NOTE — Progress Notes (Signed)
NPO AFTER MN. ARRIVES AT 0715. NEEDS HG AND URINE PREG. 

## 2011-05-12 NOTE — H&P (Signed)
Kristy Wallace is an 41 y.o. female.   Chief Complaint:  Here for cystoscopy, RPG, double-J stent placement possible ureteroscopy. HPI: Kristy Wallace returns for follow-up. Again, her situation has been complicated. She is status post a second-look procedure for again, a very large staghorn stone. At that time Holmium laser lithotripsy was used. We felt that we broke the stone up into passable fragments. She has done well clinically. She has however attempted to plug the tube and did feel quite a bit of flank pressure. On CT imaging today, she clearly still had some residual stone material primarily in the upper pole. At this point I suspect she has about 10% or less of the original stone left. It is really hard to tell how much of this is small fragments all piled on top of each other versus some bigger fragments, but given the appearance on the last endoscopy, I would favor that these are lots of small fragments. She does have a number of small ureteral fragments in her distal ureter. For that reason I think it would be premature to try to remove her nephrostomy tube and I would be concerned that she would have continued pouring out of urine from her side. I would like her over the next week or two to attempt to plug the nephrostomy tube as much as possible. Obviously, if she does develop fever, chills, severe nausea, or severe pain, she will need to hook it back to drainage. Hopefully, if she is able to tolerate the nephrostomy tube being plugged, then we could remove the nephrostomy tube and then monitor her situation. Then, it would just depend on how things go. If she had continued leakage from her flank or ongoing problems with any discomfort, then ureteroscopy and/or stent could be necessary. If down the road there continues to be some residual fragments in the upper pole, then outpatient attempt at lithotripsy may be necessary to try to completely render her stone free.   Past Medical History  Diagnosis Date    . Asthma     exercise induced asthma  . Bladder infection     hx of bladder infection  . Frequent urination at night   . HPV (human papilloma virus) infection   . Kidney stones   . Renal calculus, left   . Left ureteral calculus   . H/O nephrostomy LEFT - PATENT  . Nocturia     Past Surgical History  Procedure Date  . Dilation and curettage of uterus 1993  . Tubal ligation 2003  . Colposcopy 2011  . Nephrolithotomy 04/24/2011    Procedure: FLEXIABLE CYSTOSCOPY/ NEPHROLITHOTOMY PERCUTANEOUS SECOND LOOK;  Surgeon: Valetta Fuller, MD;  Location: [facility];  Service: Urology;  Laterality: Left;  kidney       . Percutaneous nephrolithotomy 04-03-11    LEFT KIDNEY STAGHORN STONE (DR Sonnie Bias AT [facility])    History reviewed. No pertinent family history. Social History:  reports that she has been smoking Cigarettes.  She has a 6 pack-year smoking history. She has never used smokeless tobacco. She reports that she uses illicit drugs (Marijuana). She reports that she does not drink alcohol.  Allergies:  Allergies  Allergen Reactions  . Adhesive (Tape) Itching and Rash    plastic  . Advair Diskus (Fluticasone-Salmeterol) Other (See Comments)    Flu-like symptoms.  . Azithromycin Other (See Comments)    dizziness    No current facility-administered medications on file as of .   Medications Prior to Admission  Medication  Sig Dispense Refill  . albuterol (PROVENTIL HFA;VENTOLIN HFA) 108 (90 BASE) MCG/ACT inhaler Inhale 2 puffs into the lungs every 6 (six) hours as needed. For exercise induced asthma.      Marland Kitchen HYDROcodone-acetaminophen (NORCO) 10-325 MG per tablet Take 1 tablet by mouth every 6 (six) hours as needed. Pain       . methocarbamol (ROBAXIN) 500 MG tablet Take 500 mg by mouth 3 (three) times daily as needed. Muscle spasm      . oxybutynin (DITROPAN) 5 MG tablet Take 10 mg by mouth every 8 (eight) hours as needed. Bladder        No results found for this or any previous visit  (from the past 48 hour(s)). No results found.  Review of Systems - Negative except fatigue, mild flank pain.  Height 5\' 7"  (1.702 m), weight 64.864 kg (143 lb), last menstrual period 04/28/2011. General appearance: alert, cooperative and no distress Neck: no adenopathy and no JVD Resp: clear to auscultation bilaterally Cardio: regular rate and rhythm GI: soft, non-tender; bowel sounds normal; no masses,  no organomegaly. NT site ok. Extremities: extremities normal, atraumatic, no cyanosis or edema Skin: Skin color, texture, turgor normal. No rashes or lesions Neurologic: Grossly normal  Assessment/Plan [name] is unable to tolerate clamping of the nephrostomy tube at home. This indicates to me that she likely has obstructing fragments in her distal ureter and is likely to have considerable urine output from her nephrostomy site if that is not unobstructed before removal of the nephrostomy tube. Plan assessment with retrograde pyelogram and possible ureteroscopy and double-J stent placement.  Michelle Vanhise S 05/12/2011, 8:55 AM

## 2011-05-15 ENCOUNTER — Ambulatory Visit (HOSPITAL_BASED_OUTPATIENT_CLINIC_OR_DEPARTMENT_OTHER): Payer: Medicaid Other | Admitting: Anesthesiology

## 2011-05-15 ENCOUNTER — Encounter (HOSPITAL_BASED_OUTPATIENT_CLINIC_OR_DEPARTMENT_OTHER): Payer: Self-pay | Admitting: Anesthesiology

## 2011-05-15 ENCOUNTER — Ambulatory Visit (HOSPITAL_BASED_OUTPATIENT_CLINIC_OR_DEPARTMENT_OTHER)
Admission: RE | Admit: 2011-05-15 | Discharge: 2011-05-15 | Disposition: A | Discharge status: Home / Self Care | Payer: Medicaid Other | Source: Ambulatory Visit | Attending: Urology | Admitting: Urology

## 2011-05-15 ENCOUNTER — Encounter (HOSPITAL_BASED_OUTPATIENT_CLINIC_OR_DEPARTMENT_OTHER): Payer: Self-pay | Admitting: *Deleted

## 2011-05-15 ENCOUNTER — Encounter (HOSPITAL_BASED_OUTPATIENT_CLINIC_OR_DEPARTMENT_OTHER)
Admission: RE | Disposition: A | Discharge status: Home / Self Care | Payer: Self-pay | Source: Ambulatory Visit | Attending: Urology

## 2011-05-15 DIAGNOSIS — Z79899 Other long term (current) drug therapy: Secondary | ICD-10-CM | POA: Insufficient documentation

## 2011-05-15 DIAGNOSIS — J45909 Unspecified asthma, uncomplicated: Secondary | ICD-10-CM | POA: Insufficient documentation

## 2011-05-15 DIAGNOSIS — N201 Calculus of ureter: Secondary | ICD-10-CM | POA: Insufficient documentation

## 2011-05-15 DIAGNOSIS — Z436 Encounter for attention to other artificial openings of urinary tract: Secondary | ICD-10-CM | POA: Insufficient documentation

## 2011-05-15 HISTORY — DX: Calculus of kidney: N20.0

## 2011-05-15 HISTORY — DX: Nocturia: R35.1

## 2011-05-15 HISTORY — DX: Calculus of ureter: N20.1

## 2011-05-15 HISTORY — DX: Reserved for concepts with insufficient information to code with codable children: IMO0002

## 2011-05-15 HISTORY — PX: CYSTOSCOPY/RETROGRADE/URETEROSCOPY: SHX5316

## 2011-05-15 LAB — POCT PREGNANCY, URINE: Preg Test, Ur: NEGATIVE

## 2011-05-15 SURGERY — CYSTOSCOPY/RETROGRADE/URETEROSCOPY
Anesthesia: General | Site: Ureter | Laterality: Left | Wound class: Clean Contaminated

## 2011-05-15 MED ORDER — SODIUM CHLORIDE 0.9 % IR SOLN
Status: DC | PRN
  Administered 2011-05-15: 6000 mL

## 2011-05-15 MED ORDER — FENTANYL CITRATE 0.05 MG/ML IJ SOLN
INTRAMUSCULAR | Status: DC | PRN
  Administered 2011-05-15 (×4): 50 ug via INTRAVENOUS

## 2011-05-15 MED ORDER — LIDOCAINE HCL (CARDIAC) 20 MG/ML IV SOLN
INTRAVENOUS | Status: DC | PRN
  Administered 2011-05-15: 80 mg via INTRAVENOUS

## 2011-05-15 MED ORDER — LACTATED RINGERS IV SOLN
INTRAVENOUS | Status: DC
  Administered 2011-05-15: 08:00:00 via INTRAVENOUS
  Administered 2011-05-15: 100 mL/h via INTRAVENOUS

## 2011-05-15 MED ORDER — BELLADONNA ALKALOIDS-OPIUM 16.2-60 MG RE SUPP
RECTAL | Status: DC | PRN
  Administered 2011-05-15: 1 via RECTAL

## 2011-05-15 MED ORDER — DEXAMETHASONE SODIUM PHOSPHATE 4 MG/ML IJ SOLN
INTRAMUSCULAR | Status: DC | PRN
  Administered 2011-05-15: 10 mg via INTRAVENOUS

## 2011-05-15 MED ORDER — LIDOCAINE HCL 2 % EX GEL
CUTANEOUS | Status: DC | PRN
  Administered 2011-05-15: 1

## 2011-05-15 MED ORDER — PROPOFOL 10 MG/ML IV EMUL
INTRAVENOUS | Status: DC | PRN
  Administered 2011-05-15: 200 mg via INTRAVENOUS

## 2011-05-15 MED ORDER — PROMETHAZINE HCL 25 MG/ML IJ SOLN: 6.2500 mg | INTRAMUSCULAR | Status: DC | PRN

## 2011-05-15 MED ORDER — GENTAMICIN IN SALINE 1.6-0.9 MG/ML-% IV SOLN
80.0000 mg | INTRAVENOUS | Status: AC
  Administered 2011-05-15: 80 mg via INTRAVENOUS

## 2011-05-15 MED ORDER — FENTANYL CITRATE 0.05 MG/ML IJ SOLN: 25.0000 ug | INTRAMUSCULAR | Status: DC | PRN

## 2011-05-15 MED ORDER — LACTATED RINGERS IV SOLN: INTRAVENOUS | Status: DC

## 2011-05-15 MED ORDER — ONDANSETRON HCL 4 MG/2ML IJ SOLN
INTRAMUSCULAR | Status: DC | PRN
  Administered 2011-05-15: 4 mg via INTRAVENOUS

## 2011-05-15 MED ORDER — IOHEXOL 350 MG/ML SOLN
INTRAVENOUS | Status: DC | PRN
  Administered 2011-05-15: 50 mL via INTRAVENOUS

## 2011-05-15 SURGICAL SUPPLY — 32 items
ADAPTER CATH URET PLST 4-6FR (CATHETERS) IMPLANT
BAG DRAIN URO-CYSTO SKYTR STRL (DRAIN) ×2 IMPLANT
BASKET LASER NITINOL 1.9FR (BASKET) IMPLANT
BASKET STNLS GEMINI 4WIRE 3FR (BASKET) IMPLANT
BASKET ZERO TIP NITINOL 2.4FR (BASKET) ×2 IMPLANT
BENZOIN TINCTURE PRP APPL 2/3 (GAUZE/BANDAGES/DRESSINGS) IMPLANT
CANISTER SUCT LVC 12 LTR MEDI- (MISCELLANEOUS) ×2 IMPLANT
CATH INTERMIT  6FR 70CM (CATHETERS) ×2 IMPLANT
CATH URET 5FR 28IN CONE TIP (BALLOONS) ×1
CATH URET 5FR 28IN OPEN ENDED (CATHETERS) IMPLANT
CATH URET 5FR 70CM CONE TIP (BALLOONS) ×1 IMPLANT
CLOTH BEACON ORANGE TIMEOUT ST (SAFETY) ×2 IMPLANT
DRAPE CAMERA CLOSED 9X96 (DRAPES) ×2 IMPLANT
GLOVE BIO SURGEON STRL SZ7.5 (GLOVE) ×2 IMPLANT
GOWN STRL REIN XL XLG (GOWN DISPOSABLE) ×2 IMPLANT
GOWN SURGICAL LARGE (GOWNS) ×2 IMPLANT
GUIDEWIRE 0.038 PTFE COATED (WIRE) IMPLANT
GUIDEWIRE ANG ZIPWIRE 038X150 (WIRE) IMPLANT
GUIDEWIRE STR DUAL SENSOR (WIRE) ×2 IMPLANT
IV NS IRRIG 3000ML ARTHROMATIC (IV SOLUTION) ×4 IMPLANT
KIT BALLIN UROMAX 15FX10 (LABEL) IMPLANT
KIT BALLN UROMAX 15FX4 (MISCELLANEOUS) IMPLANT
KIT BALLN UROMAX 26 75X4 (MISCELLANEOUS)
LASER FIBER DISP (UROLOGICAL SUPPLIES) ×2 IMPLANT
LASER FIBER DISP 1000U (UROLOGICAL SUPPLIES) IMPLANT
NS IRRIG 500ML POUR BTL (IV SOLUTION) IMPLANT
PACK CYSTOSCOPY (CUSTOM PROCEDURE TRAY) ×2 IMPLANT
SET HIGH PRES BAL DIL (LABEL)
SHEATH URET ACCESS 12FR/35CM (UROLOGICAL SUPPLIES) ×2 IMPLANT
SHEATH URET ACCESS 12FR/55CM (UROLOGICAL SUPPLIES) IMPLANT
STENT 6X24 (STENTS) IMPLANT
STENT URET 6FRX24 CONTOUR (STENTS) ×2 IMPLANT

## 2011-05-15 NOTE — Op Note (Signed)
Preoperative diagnosis: Left distal ureteral stone fragments status post percutaneous nephrolithotomy. Postoperative diagnosis: Same  Procedure: Cystoscopy, left retrograde pyelography, left ureteroscopy with holmium laser lithotripsy and left double-J stent placement   Surgeon: Valetta Fuller M.D.  Anesthesia: Gen.  Indications: Ms. 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 She has had a left percutaneous nephrostomy tube indwelling. Recent CT showed near complete resolution of her renal stone save some fragments in the upper pole calyx. She was noted however to have some distal ureteral stone fragments. She's been  unable to tolerate clamping of the nephrostomy tube suggesting that these fragments are indeed obstructing. We have recommended retrograde pyelography with stent placement possible ureteroscopy. Her nephrostomy tube did spontaneously fall out approximately 48 hours ago she has had some high output urine draining area drainage from the nephrostomy tube site.      Technique and findings: Patient was brought to the operating room where she is successful induction of general anesthesia. She was placed in lithotomy position and prepped and draped in usual manner. She received perioperative antibiotics. Appropriate surgical timeout was performed. Cystoscopy was unremarkable. A left retrograde pyelogram was done with fluoroscopic interpretation utilizing a 8 French cone-tipped catheter. The patient appeared to have high-grade obstruction of her distal ureter approximately 2-3 cm above the ureterovesical junction. Initial attempts to place a guidewire through this area were unsuccessful. After placing the wire through an open-ended catheter we were able to manipulated into the left renal pelvis. The distal left ureter was engaged with a rigid 6 French ureteroscope. Multiple fragments were noted. A 360  holmium laser lithotripter  fiber was used to break up the fragments. A power of 0.5 with a rate of 8 was utilized. These larger pieces were broken up into innumerable small pieces which did flush out of the ureter. Repeat ureteroscopy revealed no additional large fragments. A 6 French 24 cm double-J stent was then placed over the guidewire with fluoroscopic as well as visual guidance confirming good position. The patient appeared to tolerate the procedure well there were no obvious complications or problems. She was brought to room in stable condition.

## 2011-05-15 NOTE — Anesthesia Preprocedure Evaluation (Signed)
Anesthesia Evaluation  Patient identified by MRN, date of birth, ID band Patient awake    Reviewed: Allergy & Precautions, H&P , NPO status , Patient's Chart, lab work & pertinent test results  Airway Mallampati: II TM Distance: >3 FB Neck ROM: Full    Dental No notable dental hx. (+) Teeth Intact and Dental Advisory Given   Pulmonary neg pulmonary ROS, asthma (Exercise induced) , Current Smoker (1/2 ppd),  breath sounds clear to auscultation  Pulmonary exam normal       Cardiovascular negative cardio ROS  Rhythm:Regular Rate:Normal     Neuro/Psych negative neurological ROS  negative psych ROS   GI/Hepatic negative GI ROS, Neg liver ROS,   Endo/Other  negative endocrine ROS  Renal/GU negative Renal ROS  negative genitourinary   Musculoskeletal negative musculoskeletal ROS (+)   Abdominal   Peds negative pediatric ROS (+)  Hematology negative hematology ROS (+)   Anesthesia Other Findings   Reproductive/Obstetrics negative OB ROS                           Anesthesia Physical Anesthesia Plan  ASA: II  Anesthesia Plan: General   Post-op Pain Management:    Induction: Intravenous  Airway Management Planned: LMA  Additional Equipment:   Intra-op Plan:   Post-operative Plan:   Informed Consent: I have reviewed the patients History and Physical, chart, labs and discussed the procedure including the risks, benefits and alternatives for the proposed anesthesia with the patient or authorized representative who has indicated his/her understanding and acceptance.   Dental advisory given  Plan Discussed with: CRNA  Anesthesia Plan Comments:         Anesthesia Quick Evaluation

## 2011-05-15 NOTE — Discharge Instructions (Addendum)
DISCHARGE INSTRUCTIONS FOR KIDNEY STONES OR URETERAL STENT  MEDICATIONS:   1. DO NOT RESUME YOUR ASPIRIN, or any other medicines like ibuprofen, motrin, excedrin, advil, aleve, vitamin E, fish oil as these can all cause bleeding x 7 days.  2. Resume all your other meds from home - except do not take any other pain meds that you may have at home.  ACTIVITY 1. No strenuous activity x 1week 2. No driving while on narcotic pain medications 3. Drink plenty of water 4. Continue to walk at home - you can still get blood clots when you are at home, so keep active, but don't over do it. 5. May return to work in 3 days.  BATHING 1. You can shower and we recommend daily showers  2. If you have a string coming from your urethra:  The stent string is attached to your ureteral stent.  Do not pull on this.   SIGNS/SYMPTOMS TO CALL: 1. Please call us if you have a fever greater than 101.5, uncontrolled  nausea/vomiting, uncontrolled pain, dizziness, unable to urinate, bloody urine, chest pain, shortness of breath, leg swelling, leg pain, redness around wound, drainage from wound, or any other concerns or questions.  You can reach [place] at [phone].  FOLLOW-UP 1. You have an appointment 3-14 . We will call to change this till a later date 2.  3.

## 2011-05-15 NOTE — Anesthesia Postprocedure Evaluation (Signed)
Anesthesia Post Note  Patient: Kristy Wallace  Procedure(s) Performed: Procedure(s) (LRB): CYSTOSCOPY/RETROGRADE/URETEROSCOPY (Left) HOLMIUM LASER APPLICATION (Left) CYSTOSCOPY WITH STENT PLACEMENT (Left)  Anesthesia type: General  Patient location: PACU  Post pain: Pain level controlled  Post assessment: Post-op Vital signs reviewed  Last Vitals:  Filed Vitals:   05/15/11 0910  BP: 123/80  Pulse: 89  Temp: 36.2 C  Resp: 16    Post vital signs: Reviewed  Level of consciousness: sedated  Complications: No apparent anesthesia complications

## 2011-05-15 NOTE — Anesthesia Procedure Notes (Addendum)
Procedure Name: LMA Insertion Date/Time: 05/15/2011 8:26 AM Performed by: Fran Lowes Pre-anesthesia Checklist: Patient identified, Emergency Drugs available, Suction available and Patient being monitored Patient Re-evaluated:Patient Re-evaluated prior to inductionOxygen Delivery Method: Circle System Utilized Preoxygenation: Pre-oxygenation with 100% oxygen Intubation Type: IV induction Ventilation: Mask ventilation without difficulty LMA: LMA with gastric port inserted LMA Size: 4.0 Number of attempts: 1 Placement Confirmation: positive ETCO2 Tube secured with: Tape Dental Injury: Teeth and Oropharynx as per pre-operative assessment

## 2011-05-15 NOTE — Interval H&P Note (Signed)
History and Physical Interval Note:  05/15/2011 7:46 AM  Kristy Wallace  has presented today for surgery, with the diagnosis of Left Renal and Ureteral Calculi  The various methods of treatment have been discussed with the patient and family. After consideration of risks, benefits and other options for treatment, the patient has consented to  Procedure(s) (LRB): CYSTOSCOPY/RETROGRADE/URETEROSCOPY (Left) as a surgical intervention .  The patients' history has been reviewed, patient examined, no change in status, stable for surgery.  I have reviewed the patients' chart and labs.  Questions were answered to the patient's satisfaction.     [name]

## 2011-05-15 NOTE — Transfer of Care (Signed)
Immediate Anesthesia Transfer of Care Note  Patient: Kristy Wallace  Procedure(s) Performed: Procedure(s) (LRB): CYSTOSCOPY/RETROGRADE/URETEROSCOPY (Left) HOLMIUM LASER APPLICATION (Left) CYSTOSCOPY WITH STENT PLACEMENT (Left)  Patient Location: Patient transported to PACU with oxygen via face mask at 4 Liters / Min  Anesthesia Type: General  Level of Consciousness: awake and alert   Airway & Oxygen Therapy: Patient Spontanous Breathing and Patient connected to face mask oxygen  Post-op Assessment: Report given to PACU RN and Post -op Vital signs reviewed and stable  Post vital signs: Reviewed and stable  Dentition: Teeth and oropharynx remain in pre-op condition  Complications: No apparent anesthesia complications

## 2011-05-16 ENCOUNTER — Encounter (HOSPITAL_BASED_OUTPATIENT_CLINIC_OR_DEPARTMENT_OTHER): Payer: Self-pay | Admitting: Urology

## 2011-10-27 ENCOUNTER — Encounter (HOSPITAL_COMMUNITY): Payer: Self-pay

## 2012-09-17 ENCOUNTER — Other Ambulatory Visit: Payer: Self-pay | Admitting: Urology

## 2012-09-23 ENCOUNTER — Encounter (HOSPITAL_COMMUNITY): Payer: Self-pay | Admitting: Pharmacy Technician

## 2012-09-24 ENCOUNTER — Encounter (HOSPITAL_COMMUNITY): Payer: Self-pay | Admitting: *Deleted

## 2012-09-24 NOTE — Progress Notes (Signed)
Spoke to patient via phone,history obtained,updated.  Bring blue folder,insurance cards,picture ID,designated driver and living will,POA, if desires (to be placed on chart). Reinforced no aspirin(instructions to hold aspirin per your doctor), ibuprofen products 72 hours prior to procedure. No vitamins or herbal medicines 7 days prior to procedure.   Follow laxative instructions provided by urologist (office) and in blue folder. Wear easy on/off clothing and no jewelry except wedding rings and ear rings. Leave all other valuables at home. Verbalizes understanding of instructions To arrive at [phone]

## 2012-09-27 NOTE — H&P (Signed)
Reason For Visit            Giana returns for routine six-month followup with KUB imaging. She presented in May of this year with some left flank pain. Ultrasound showed no hydronephrosis at that time. A CT was not repeated. She subsequently came in for evaluation of dysuria. Urine culture was negative at that time. She is currently feeling fairly good at this time. She has not had any significant abdominal or flank pain. No dysuria no gross hematuria.  Repeat KUB was performed. She continues to have active stone burden  on the left side. There is a upper pole calcification as well as 3-4 smaller stones in the lower pole. These appear to be unchanged from her imaging studies 6 months ago. There is a calcification in the left hemipelvis as his unchanged over a number of months and does not appear to be a ureteral calculus.     History of Present Illness      Past Gu Hx:  Kristy Wallace returns for follow-up.  Again, she has had a  complicated history over the past year.  She had an extremely large staghorn stone measuring as much as 6 cm in length.  She underwent a percutaneous procedure ( 1/ 2013) followed by a second look.  She ended up having some distal ureteral fragments that required ureteroscopy.  At that point we put a double-J stent in and her nephrostomy site cleared up and stopped draining almost immediately.  She has healed up and recovered well.    CT scan in early June of this year showed a questionable stone in the left ureteral pelvic junction area. There did not appear to be significant obstruction.  KUB in September of 2013 showed number of stone fragments in both the upper and lower pole the kidney but I did not appreciate any obvious calcifications/stones over the course of the ureter.   Past Medical History Problems  1. History of  Asthma 493.90 2. History of  Nephrolithiasis V13.01  Surgical History Problems  1. History of  Cystoscopy With Insertion Of Ureteral Stent Left 2.  History of  Cystoscopy With Ureteroscopy With Lithotripsy 3. History of  Dilation And Curettage 4. History of  Percutaneous Lithotomy For Stone Over 2cm. 5. History of  Percutaneous Lithotomy For Stone Over 2cm. 6. History of  Renal Endoscopy Via Nephrostomy With Ureteral Catheterizati 7. History of  Tubal Ligation V25.2  Current Meds 1. Ketorolac Tromethamine 10 MG Oral Tablet; TAKE 1 TABLET EVERY 6 HOURS AS NEEDED;  Therapy: 13May2014 to (Evaluate:23May2014)  Requested for: 13May2014; Last Rx:13May2014 2. TraMADol HCl 50 MG Oral Tablet; TAKE 1 TABLET Every 6 hours PRN; Therapy: 13May2014 to  (Last Rx:13May2014)  Requested for: 13May2014  Allergies Medication  1. Advair Diskus MISC 2. Zithromax TABS  Family History Problems  1. Family history of  Death In The Family Father died age 23-Heart attacl 2. Maternal history of  Depression (Symptom) 3. Maternal history of  Diabetes Mellitus V18.0 4. Family history of  Family Health Status Number Of Children 2 boys and 3 girls 5. Paternal history of  Heart Disease V17.49  Social History Problems  1. Caffeine Use 2-3 per day 2. Marital History - Divorced V61.03 3. Occupation: stay at home Mom 4. Tobacco Use 305.1 Smokes about 1/2 ppd for 13 yrs Denied  5. History of  Alcohol Use  Review of Systems Genitourinary, constitutional, skin, eye, otolaryngeal, hematologic/lymphatic, cardiovascular, pulmonary, endocrine, musculoskeletal, gastrointestinal, neurological and psychiatric system(s) were reviewed  and pertinent findings if present are noted.  Genitourinary: no dysuria and no hematuria.  Gastrointestinal: no nausea, no flank pain and no abdominal pain.  Constitutional: feeling tired (fatigue), but no fever.  Musculoskeletal: back pain.  Neurological: headache.    Vitals Vital Signs [Data Includes: Last 1 Day]  11Jul2014 09:16AM  Blood Pressure: 119 / 78 Temperature: 97.7 F Heart Rate: 68  Physical Exam Constitutional:  Well nourished and well developed . No acute distress.  Neck: The appearance of the neck is normal and no neck mass is present.  Pulmonary: No respiratory distress and normal respiratory rhythm and effort.  Cardiovascular: Heart rate and rhythm are normal . No peripheral edema.  Abdomen: The abdomen is soft and nontender. No masses are palpated. No CVA tenderness. No hernias are palpable. No hepatosplenomegaly noted.  Skin: Normal skin turgor, no visible rash and no visible skin lesions.  Neuro/Psych:. Mood and affect are appropriate.    Results/Data Urine [Data Includes: Last 1 Day]   11Jul2014  COLOR YELLOW   APPEARANCE CLEAR   SPECIFIC GRAVITY 1.010   pH 6.0   GLUCOSE NEG mg/dL  BILIRUBIN NEG   KETONE NEG mg/dL  BLOOD NEG   PROTEIN NEG mg/dL  UROBILINOGEN 0.2 mg/dL  NITRITE NEG   LEUKOCYTE ESTERASE SMALL   SQUAMOUS EPITHELIAL/HPF NONE SEEN   WBC 3-6 WBC/hpf  RBC 0-2 RBC/hpf  BACTERIA RARE   CRYSTALS NONE SEEN   CASTS NONE SEEN    Assessment Assessed  1. Nephrolithiasis Of The Left Kidney 592.0  Plan Health Maintenance (V70.0)  1. UA With REFLEX  Done: 11Jul2014 08:58AM  Discussion/Summary  Doing well clinically. We talked about the pros and cons of elective ESWL. It  would be exceedingly unlikely that will will obtain complete elimination of her stone burden without several treatments. We'll start with the upper pole stone and work our way down depending  on how she does clinically. We'll set this up on a nonurgent basis for some time probably the next few weeks to several months.   Signatures Electronically signed by : Barron Alvine, M.D.; Sep 13 2012  9:31AM

## 2012-09-30 ENCOUNTER — Encounter (HOSPITAL_COMMUNITY)
Admission: RE | Disposition: A | Discharge status: Home / Self Care | Payer: Self-pay | Source: Ambulatory Visit | Attending: Urology

## 2012-09-30 ENCOUNTER — Encounter (HOSPITAL_COMMUNITY): Payer: Self-pay | Admitting: *Deleted

## 2012-09-30 ENCOUNTER — Ambulatory Visit (HOSPITAL_COMMUNITY)
Admission: RE | Admit: 2012-09-30 | Discharge: 2012-09-30 | Disposition: A | Discharge status: Home / Self Care | Payer: Medicaid Other | Source: Ambulatory Visit | Attending: Urology | Admitting: Urology

## 2012-09-30 ENCOUNTER — Ambulatory Visit (HOSPITAL_COMMUNITY): Payer: Medicaid Other

## 2012-09-30 DIAGNOSIS — Z888 Allergy status to other drugs, medicaments and biological substances status: Secondary | ICD-10-CM | POA: Insufficient documentation

## 2012-09-30 DIAGNOSIS — Z87442 Personal history of urinary calculi: Secondary | ICD-10-CM | POA: Insufficient documentation

## 2012-09-30 DIAGNOSIS — Z881 Allergy status to other antibiotic agents status: Secondary | ICD-10-CM | POA: Insufficient documentation

## 2012-09-30 DIAGNOSIS — J45909 Unspecified asthma, uncomplicated: Secondary | ICD-10-CM | POA: Insufficient documentation

## 2012-09-30 DIAGNOSIS — N200 Calculus of kidney: Secondary | ICD-10-CM | POA: Insufficient documentation

## 2012-09-30 DIAGNOSIS — F17200 Nicotine dependence, unspecified, uncomplicated: Secondary | ICD-10-CM | POA: Insufficient documentation

## 2012-09-30 DIAGNOSIS — Z79899 Other long term (current) drug therapy: Secondary | ICD-10-CM | POA: Insufficient documentation

## 2012-09-30 DIAGNOSIS — Z9851 Tubal ligation status: Secondary | ICD-10-CM | POA: Insufficient documentation

## 2012-09-30 SURGERY — LITHOTRIPSY, ESWL
Anesthesia: LOCAL | Laterality: Left

## 2012-09-30 MED ORDER — CEFAZOLIN SODIUM-DEXTROSE 2-3 GM-% IV SOLR
2.0000 g | INTRAVENOUS | Status: AC
  Administered 2012-09-30: 2 g via INTRAVENOUS
  Filled 2012-09-30: qty 50

## 2012-09-30 MED ORDER — HYDROCODONE-ACETAMINOPHEN 5-325 MG PO TABS: 1.0000 | ORAL_TABLET | Freq: Four times a day (QID) | ORAL | Status: DC | PRN

## 2012-09-30 MED ORDER — DIAZEPAM 5 MG PO TABS
10.0000 mg | ORAL_TABLET | ORAL | Status: AC
Start: 2012-09-30 — End: 2012-09-30
  Administered 2012-09-30: 10 mg via ORAL
  Filled 2012-09-30: qty 2

## 2012-09-30 MED ORDER — DEXTROSE-NACL 5-0.45 % IV SOLN
INTRAVENOUS | Status: DC
  Administered 2012-09-30: 08:00:00 via INTRAVENOUS

## 2012-09-30 MED ORDER — DIPHENHYDRAMINE HCL 25 MG PO CAPS
25.0000 mg | ORAL_CAPSULE | ORAL | Status: AC
  Administered 2012-09-30: 25 mg via ORAL
  Filled 2012-09-30: qty 1

## 2012-09-30 NOTE — Op Note (Signed)
See [facility] OP note scanned into chart. 

## 2012-09-30 NOTE — Interval H&P Note (Signed)
History and Physical Interval Note:  09/30/2012 8:37 AM  Kristy Wallace  has presented today for surgery, with the diagnosis of LEFT RENAL CALCULI  The various methods of treatment have been discussed with the patient and family. After consideration of risks, benefits and other options for treatment, the patient has consented to  Procedure(s): LEFT EXTRACORPOREAL SHOCK WAVE LITHOTRIPSY (ESWL) (Left) as a surgical intervention .  The patient's history has been reviewed, patient examined, no change in status, stable for surgery.  I have reviewed the patient's chart and labs.  Questions were answered to the patient's satisfaction.     Lamya Lausch S

## 2012-11-20 ENCOUNTER — Emergency Department: Payer: Self-pay | Admitting: Emergency Medicine

## 2012-11-20 LAB — BASIC METABOLIC PANEL
Anion Gap: 2 — ABNORMAL LOW (ref 7–16)
Calcium, Total: 9.3 mg/dL (ref 8.5–10.1)
Chloride: 110 mmol/L — ABNORMAL HIGH (ref 98–107)
Creatinine: 0.73 mg/dL (ref 0.60–1.30)
EGFR (African American): >60
Glucose: 86 mg/dL (ref 65–99)
Osmolality: 273 (ref 275–301)
Potassium: 4.1 mmol/L (ref 3.5–5.1)

## 2012-11-20 LAB — CBC
HCT: 41.5 % (ref 35.0–47.0)
MCH: 31.2 pg (ref 26.0–34.0)
RBC: 4.52 10*6/uL (ref 3.80–5.20)
WBC: 5.7 10*3/uL (ref 3.6–11.0)

## 2012-11-20 LAB — URINALYSIS, COMPLETE
Blood: NEGATIVE
Glucose,UR: NEGATIVE mg/dL (ref 0–75)
Ketone: NEGATIVE
Leukocyte Esterase: NEGATIVE
Nitrite: NEGATIVE
Ph: 6 (ref 4.5–8.0)
Protein: NEGATIVE
RBC,UR: NONE SEEN /HPF (ref 0–5)

## 2013-03-03 IMAGING — CT CT ABD-PELV W/O CM
2 of 4 series · 17 of 46 positions shown, 19 images · non-contrast
Comparison: Images from nephrostomy placement 04/03/2011 and one-
view abdomen 03/02/2011.

CLINICAL DATA: Follow-up staghorn calculus status post left
nephrostomy 04/03/2011.

CT ABDOMEN AND PELVIS WITHOUT CONTRAST
TECHNIQUE: Multidetector CT imaging of the abdomen and pelvis was
performed following the standard protocol without intravenous
contrast.

[Series 2: under 200# stone no prev · axial · 0.61mm/px · z∈[-400,+0]mm · 14 of 88 slices shown, 16 images]
[im 4/88  soft-tissue]
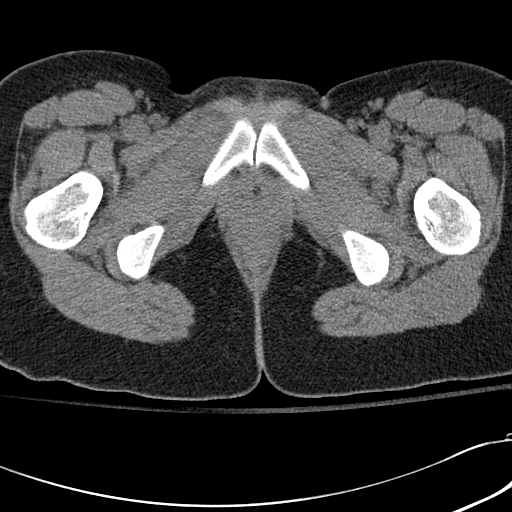
[im 4/88  bone]
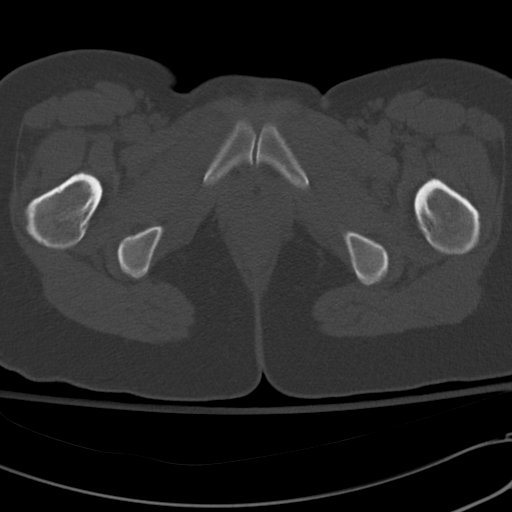
[im 11/88  soft-tissue]
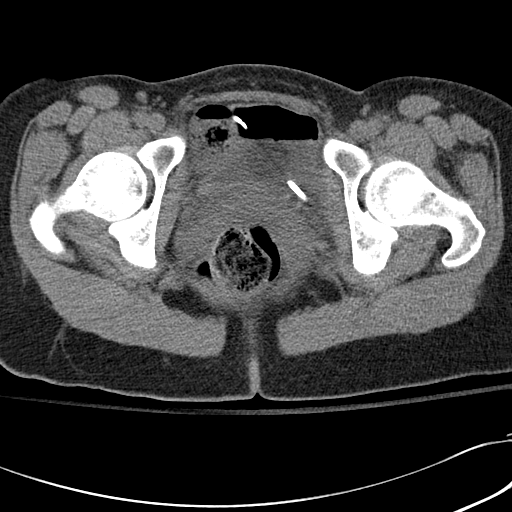
[im 18/88  soft-tissue]
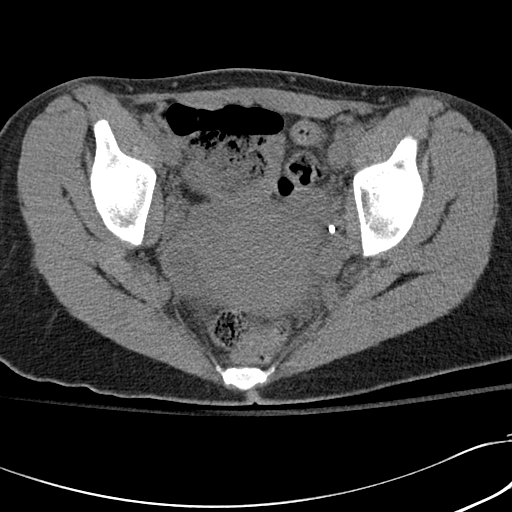
[im 25/88  soft-tissue]
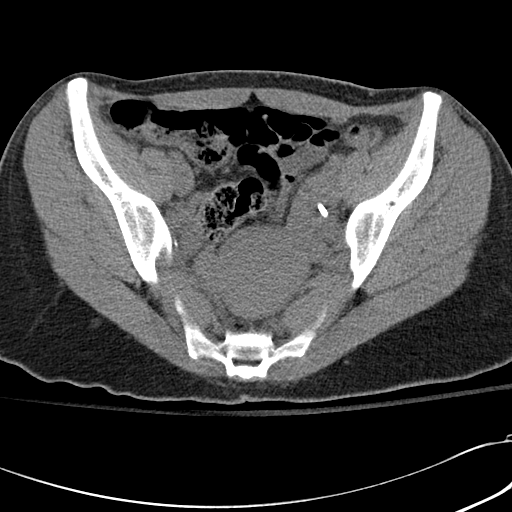
[im 28/88  soft-tissue]
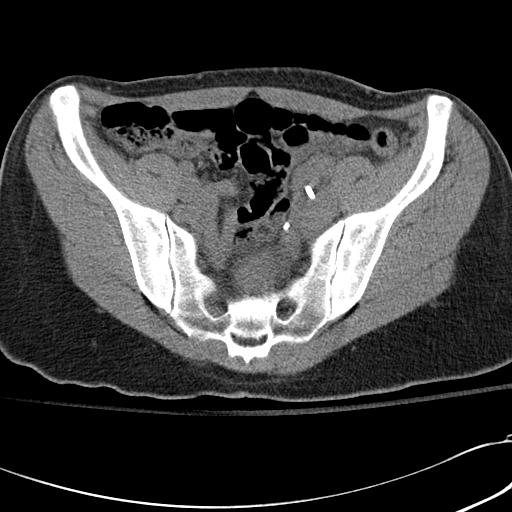
[im 35/88  soft-tissue]
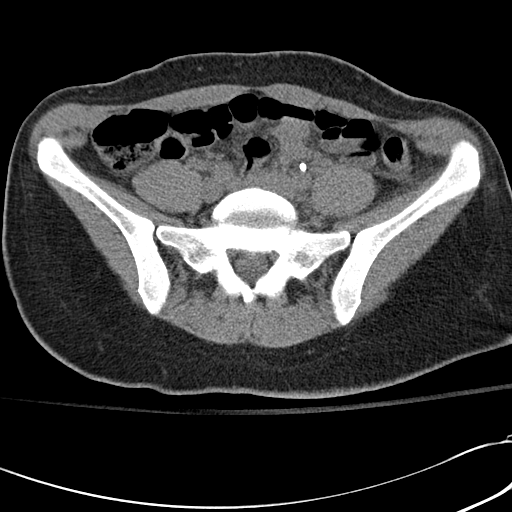
[im 42/88  soft-tissue]
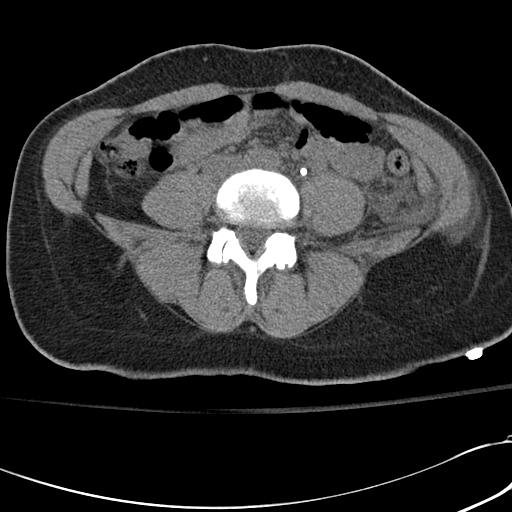
[im 46/88  soft-tissue]
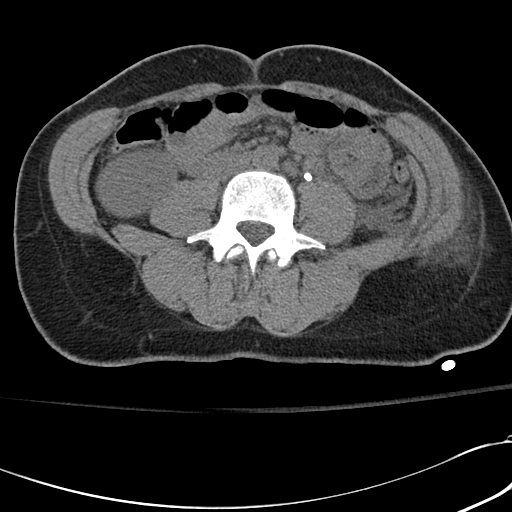
[im 53/88  soft-tissue]
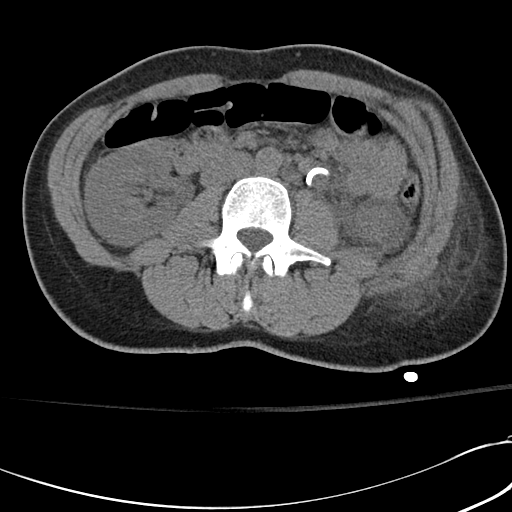
[im 53/88  bone]
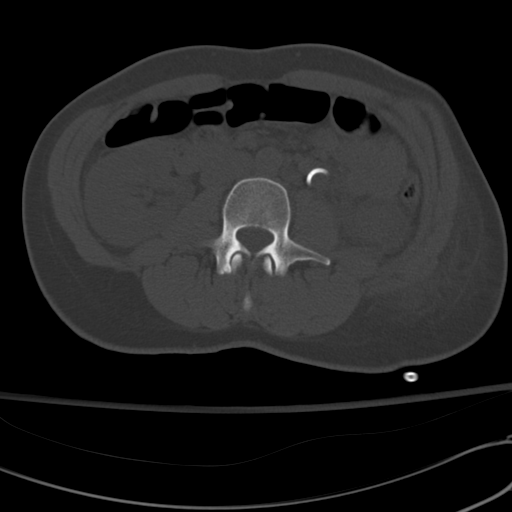
[im 60/88  soft-tissue]
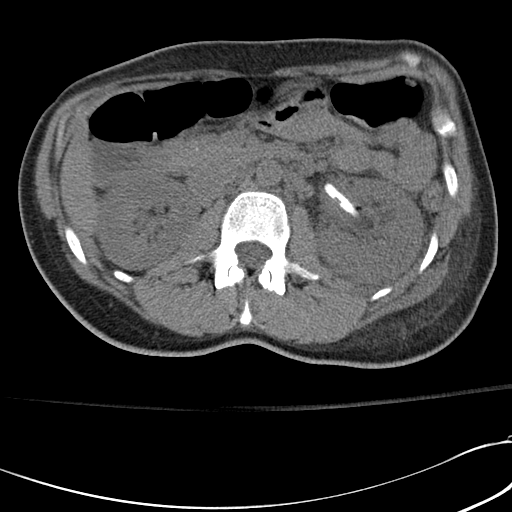
[im 67/88  soft-tissue]
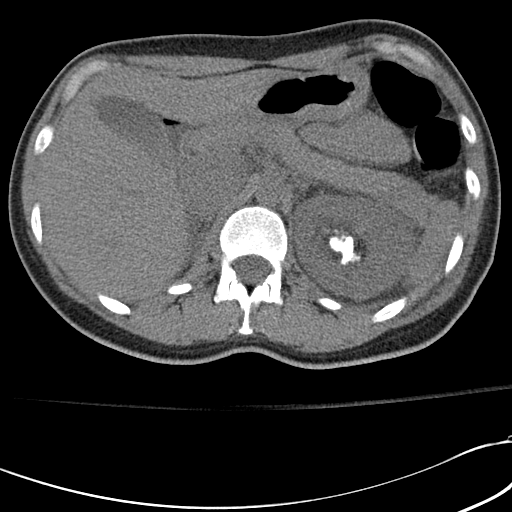
[im 70/88  soft-tissue]
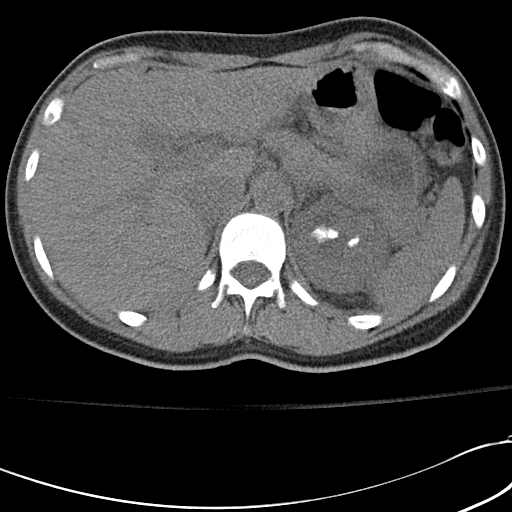
[im 77/88  soft-tissue]
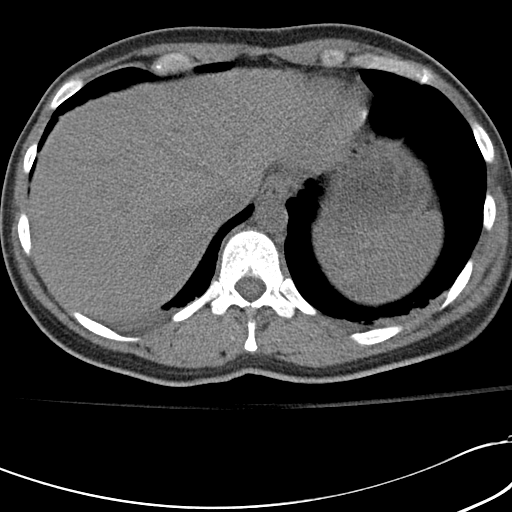
[im 84/88  soft-tissue]
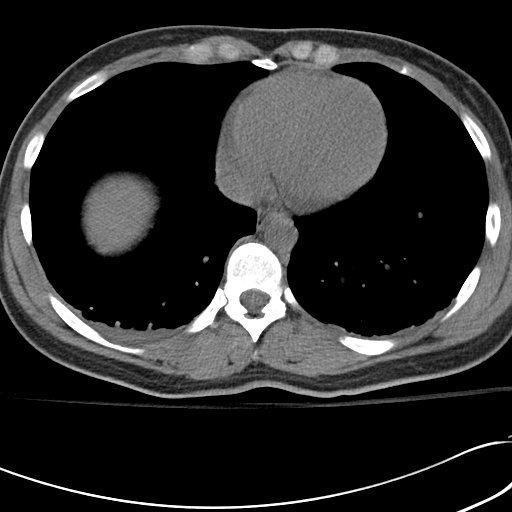

[Series 602: <mpr thick range> · coronal · 0.86mm/px · 3 of 72 slices shown]
[im 24/72  soft-tissue]
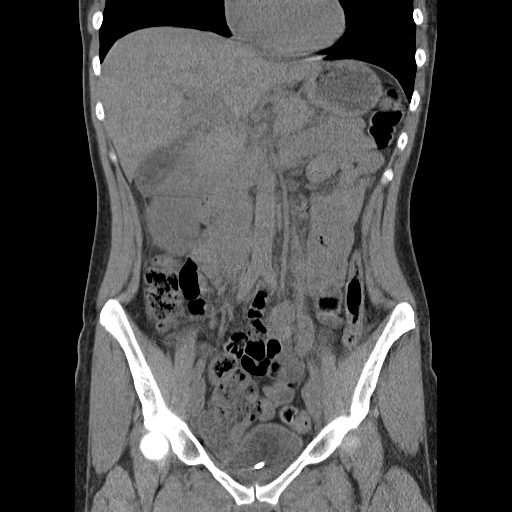
[im 32/72  soft-tissue]
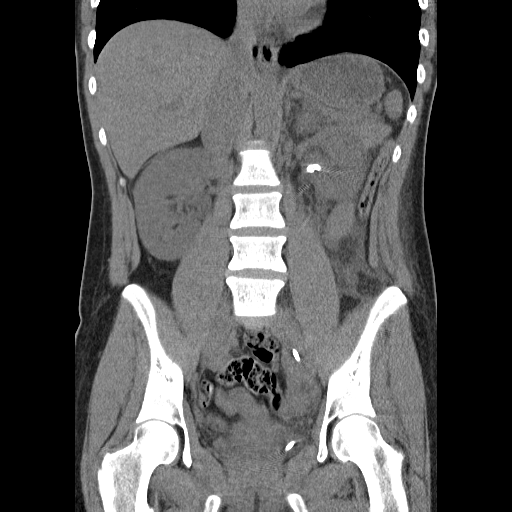
[im 40/72  soft-tissue]
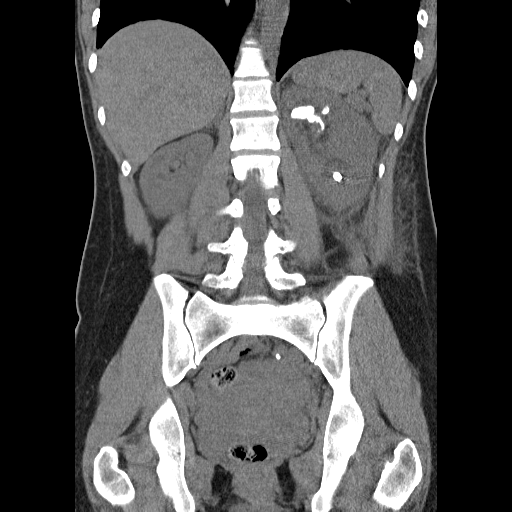

[17 of 46 positions shown; findings below may reference images not displayed]

FINDINGS: Left percutaneous nephrostomy tip is within the left
renal pelvis.  There is a left ureteral stent extending from the
ureteral pelvic junction into the bladder.  Fragments of the
residual staghorn calculus are present within the upper pole
calyces, measuring up to 3.4 cm in diameter.  There is mild
collecting system dilatation and mild perinephric soft tissue
stranding.  A small amount of air is present within the left
collecting system and the bladder.  There are no stone fragments
along the course of the ureteral stent.  There is no evidence of
stone fragment in the urinary bladder.

A tiny calculus is present in the lower pole of the right kidney.
There is no right-sided collecting system dilatation.

Mild atelectasis is present at both lung bases.  The liver, spleen,
pancreas, gallbladder and adrenal glands demonstrate no
abnormalities.

There is a small amount of soft tissue edema within the left flank
and within the left retroperitoneum, attributed to nephrostomy
placement.  No drainable fluid collection is identified.
Retroverted uterus is noted.  There is no adnexal mass
IMPRESSION: 1. Percutaneous nephrostomy and ureteral stent placement without
demonstrated complication.
2.  Fragmented staghorn calculus demonstrates residual fragments in
the upper pole calyces.  No stone fragments are seen along the
course of the stent or within the bladder.
3.  Mild left perinephric, retroperitoneal and subcutaneous flank
edema attributed to recent procedure.

## 2013-03-04 ENCOUNTER — Ambulatory Visit: Payer: Self-pay | Admitting: Otolaryngology

## 2013-07-30 ENCOUNTER — Ambulatory Visit: Payer: Self-pay | Admitting: Internal Medicine

## 2013-11-20 ENCOUNTER — Ambulatory Visit: Payer: Self-pay | Admitting: Gastroenterology

## 2014-02-10 ENCOUNTER — Other Ambulatory Visit: Payer: Self-pay | Admitting: Urology

## 2014-02-17 ENCOUNTER — Encounter (HOSPITAL_COMMUNITY): Payer: Self-pay | Admitting: *Deleted

## 2014-02-22 NOTE — H&P (Signed)
History of Present Illness          Kristy Wallace presents today for a 2 month follow-up. She is now approximately 2 1/2 years out from a percutaneous nephrolithotomy for a very large staghorn stone ( LEFT). She has had additional fragments and stones treated over the last 2 years. She is approximately 1 year status post ESWL to treat a 15-20 mm fragment in the left kidney. That worked extremely well and she had resolution of that stone. She continues to have some known fragments in the lower pole. On follow-up approximately a month ago she was noted to have evidence of a new stones/fragments in her proximal left ureter. We felt that this measured approximately 2 x 5 mm. She was asymptomatic but did have microhematuria at that time. She is here for assessment of that new stone event.     Past Medical History Problems  1. History of Asthma (J45.909) 2. History of kidney stones (F57.322)  Surgical History Problems  1. History of Cystoscopy With Insertion Of Ureteral Stent Left 2. History of Cystoscopy With Ureteroscopy With Lithotripsy 3. History of Dilation And Curettage 4. History of Lithotripsy 5. History of Percutaneous Lithotomy For Stone Over 2cm. 6. History of Percutaneous Lithotomy For Stone Over 2cm. 7. History of Renal Endoscopy Via Nephrostomy With Ureteral Catheterizati 8. History of Tubal Ligation  Allergies Medication  1. Advair Diskus MISC 2. Zithromax TABS  Family History Problems  1. Family history of Death In The Family Father   died age 36-Heart attacl 2. Family history of Depression (Symptom) : Mother 3. Family history of Diabetes Mellitus : Mother 4. Family history of Family Health Status Number Of Children   2 boys and 3 girls 5. Family history of Heart Disease : Father  Social History Problems  1. Denied: History of Alcohol Use 2. Caffeine Use   2-3 per day 3. Current some day smoker (F17.210) 4. Marital History - Divorced 5. Occupation:   stay at  home Mom 6. Tobacco use (Z72.0)   Smokes about 1/2 ppd for 13 yrs  Review of Systems Genitourinary, constitutional, skin, eye, otolaryngeal, hematologic/lymphatic, cardiovascular, pulmonary, endocrine, musculoskeletal, gastrointestinal, neurological and psychiatric system(s) were reviewed and pertinent findings if present are noted and are otherwise negative.  Genitourinary: no dysuria and no hematuria.  Gastrointestinal: diarrhea, but no nausea, no flank pain and no abdominal pain.  Constitutional: feeling tired (fatigue), but no fever.  ENT: sore throat and sinus problems.  Musculoskeletal: back pain.    Vitals Vital Signs [Data Includes: Last 1 Day]  Recorded: [id number] 02:10PM  Blood Pressure: 140 / 84 Temperature: 97.5 F Heart Rate: 78  Physical Exam Constitutional: Well nourished and well developed . No acute distress.  ENT:. The ears and nose are normal in appearance.  Pulmonary: No respiratory distress and normal respiratory rhythm and effort.  Cardiovascular: Heart rate and rhythm are normal . No peripheral edema.  Abdomen: The abdomen is soft and nontender. No masses are palpated. No CVA tenderness. No hernias are palpable. No hepatosplenomegaly noted.    Results/Data Urine [Data Includes: Last 1 Day]   [id number]  COLOR YELLOW   APPEARANCE CLEAR   SPECIFIC GRAVITY 1.015   pH 5.5   GLUCOSE NEG mg/dL  BILIRUBIN NEG   KETONE NEG mg/dL  BLOOD SMALL   PROTEIN NEG mg/dL  UROBILINOGEN 0.2 mg/dL  NITRITE NEG   LEUKOCYTE ESTERASE TRACE   SQUAMOUS EPITHELIAL/HPF RARE   WBC 0-2 WBC/hpf  RBC 11-20 RBC/hpf  BACTERIA  RARE   CRYSTALS NONE SEEN   CASTS NONE SEEN     On KUB imaging, Shelva continues to have evidence of about a 2 mm x 6 mm calcification right in the area of the ureteropelvic junction, unchanged in size and location from her last several KUB's. There are additional stones located primarily in the mid pole of the left kidney. Again, these stones appear  unchanged. I do not see anything obvious on the right. Urine continues to show microhematuria.      Assessment Assessed  1. Nephrolithiasis (N20.0)  Plan Nephrolithiasis  1. Follow-up Schedule Surgery Office  Follow-up  Status: Hold For -  Appointment  Requested for: [id number]  Discussion/Summary   Mariaclara has evidence of ongoing nephrolithiasis on the left side. She continues to have what looks like a 2 mm x 6 mm stone at her ureteropelvic junction, unchanged from previous imaging. She also has moderate stone burden in the left kidney, but none of the stones appear obstructing. There does not appear to have been any progression of the stone in the area of the UPJ region. I do think that it makes sense to go ahead with ESWL to treat that stone. If we can get good fragmentation, then hopefully we can use some of the shocks to treat some additional stones in that left kidney. She would like to try to get that done as soon as possible, so we will see about getting her on the schedule for the next couple of weeks.   Signatures Electronically signed by : Rana Snare, M.D.; Feb 10 2014  7:57AM EST

## 2014-02-23 ENCOUNTER — Ambulatory Visit (HOSPITAL_COMMUNITY)
Admission: RE | Admit: 2014-02-23 | Discharge: 2014-02-23 | Disposition: A | Discharge status: Home / Self Care | Payer: Medicaid Other | Source: Ambulatory Visit | Attending: Urology | Admitting: Urology

## 2014-02-23 ENCOUNTER — Encounter (HOSPITAL_COMMUNITY): Payer: Self-pay | Admitting: General Practice

## 2014-02-23 ENCOUNTER — Encounter (HOSPITAL_COMMUNITY)
Admission: RE | Disposition: A | Discharge status: Home / Self Care | Payer: Self-pay | Source: Ambulatory Visit | Attending: Urology

## 2014-02-23 ENCOUNTER — Ambulatory Visit (HOSPITAL_COMMUNITY): Payer: Medicaid Other

## 2014-02-23 DIAGNOSIS — Z87442 Personal history of urinary calculi: Secondary | ICD-10-CM | POA: Diagnosis not present

## 2014-02-23 DIAGNOSIS — F17210 Nicotine dependence, cigarettes, uncomplicated: Secondary | ICD-10-CM | POA: Insufficient documentation

## 2014-02-23 DIAGNOSIS — Z91048 Other nonmedicinal substance allergy status: Secondary | ICD-10-CM | POA: Diagnosis not present

## 2014-02-23 DIAGNOSIS — I10 Essential (primary) hypertension: Secondary | ICD-10-CM | POA: Insufficient documentation

## 2014-02-23 DIAGNOSIS — N200 Calculus of kidney: Secondary | ICD-10-CM | POA: Diagnosis not present

## 2014-02-23 DIAGNOSIS — J45909 Unspecified asthma, uncomplicated: Secondary | ICD-10-CM | POA: Diagnosis not present

## 2014-02-23 DIAGNOSIS — Z888 Allergy status to other drugs, medicaments and biological substances status: Secondary | ICD-10-CM | POA: Diagnosis not present

## 2014-02-23 SURGERY — LITHOTRIPSY, ESWL
Anesthesia: LOCAL | Laterality: Left

## 2014-02-23 MED ORDER — DIPHENHYDRAMINE HCL 25 MG PO CAPS
25.0000 mg | ORAL_CAPSULE | ORAL | Status: AC
  Administered 2014-02-23: 25 mg via ORAL
  Filled 2014-02-23: qty 1

## 2014-02-23 MED ORDER — FENTANYL CITRATE 0.05 MG/ML IJ SOLN
INTRAMUSCULAR | Status: DC
Start: 2014-02-23 — End: 2014-02-23
  Filled 2014-02-23: qty 2

## 2014-02-23 MED ORDER — CIPROFLOXACIN HCL 500 MG PO TABS
500.0000 mg | ORAL_TABLET | ORAL | Status: AC
  Administered 2014-02-23: 500 mg via ORAL
  Filled 2014-02-23: qty 1

## 2014-02-23 MED ORDER — DIAZEPAM 5 MG PO TABS
10.0000 mg | ORAL_TABLET | ORAL | Status: AC
  Administered 2014-02-23: 10 mg via ORAL
  Filled 2014-02-23: qty 2

## 2014-02-23 MED ORDER — DEXTROSE-NACL 5-0.45 % IV SOLN
INTRAVENOUS | Status: DC
  Administered 2014-02-23: 08:00:00 via INTRAVENOUS

## 2014-02-23 NOTE — Op Note (Signed)
See [facility] OP note scanned into chart. 

## 2014-02-23 NOTE — Discharge Instructions (Signed)
See [facility] discharge instructions in chart.  

## 2014-02-23 NOTE — Interval H&P Note (Signed)
History and Physical Interval Note:  02/23/2014 9:57 AM  Kristy Wallace  has presented today for surgery, with the diagnosis of LEFT RENAL CALCULI  The various methods of treatment have been discussed with the patient and family. After consideration of risks, benefits and other options for treatment, the patient has consented to  Procedure(s): LEFT EXTRACORPOREAL SHOCK WAVE LITHOTRIPSY (ESWL) (Left) as a surgical intervention .  The patient's history has been reviewed, patient examined, no change in status, stable for surgery.  I have reviewed the patient's chart and labs.  Questions were answered to the patient's satisfaction.     Kristy Wallace S

## 2014-04-24 ENCOUNTER — Ambulatory Visit: Payer: Self-pay | Admitting: Internal Medicine

## 2014-12-16 ENCOUNTER — Encounter: Admission: RE | Payer: Self-pay | Source: Ambulatory Visit

## 2014-12-16 ENCOUNTER — Ambulatory Visit: Admission: RE | Admit: 2014-12-16 | Payer: Medicaid Other | Source: Ambulatory Visit | Admitting: Otolaryngology

## 2014-12-16 HISTORY — DX: Chronic tonsillitis: J35.01

## 2014-12-16 HISTORY — DX: Hypertrophy of tonsils: J35.1

## 2014-12-16 SURGERY — TONSILLECTOMY
Anesthesia: Choice

## 2015-04-06 ENCOUNTER — Other Ambulatory Visit: Payer: Self-pay | Admitting: Internal Medicine

## 2015-04-06 DIAGNOSIS — Z1231 Encounter for screening mammogram for malignant neoplasm of breast: Secondary | ICD-10-CM

## 2015-04-15 ENCOUNTER — Ambulatory Visit: Payer: Medicaid Other

## 2015-04-19 ENCOUNTER — Ambulatory Visit
Admission: RE | Admit: 2015-04-19 | Discharge: 2015-04-19 | Disposition: A | Discharge status: Home / Self Care | Payer: Medicaid Other | Source: Ambulatory Visit | Attending: Internal Medicine | Admitting: Internal Medicine

## 2015-04-19 DIAGNOSIS — Z1231 Encounter for screening mammogram for malignant neoplasm of breast: Secondary | ICD-10-CM

## 2016-06-07 ENCOUNTER — Other Ambulatory Visit: Payer: Self-pay | Admitting: Family Medicine

## 2016-06-07 DIAGNOSIS — Z1231 Encounter for screening mammogram for malignant neoplasm of breast: Secondary | ICD-10-CM

## 2016-06-30 ENCOUNTER — Ambulatory Visit
Admission: RE | Admit: 2016-06-30 | Discharge: 2016-06-30 | Disposition: A | Discharge status: Home / Self Care | Payer: Medicaid Other | Source: Ambulatory Visit | Attending: Family Medicine | Admitting: Family Medicine

## 2016-06-30 DIAGNOSIS — Z1231 Encounter for screening mammogram for malignant neoplasm of breast: Secondary | ICD-10-CM | POA: Diagnosis present

## 2016-06-30 HISTORY — DX: Malignant (primary) neoplasm, unspecified: C80.1

## 2016-11-27 ENCOUNTER — Ambulatory Visit
Admission: RE | Admit: 2016-11-27 | Discharge: 2016-11-27 | Payer: MEDICAID | Attending: MOHS-Micrographic Surgery | Admitting: MOHS-Micrographic Surgery

## 2016-11-27 DIAGNOSIS — L570 Actinic keratosis: Secondary | ICD-10-CM

## 2016-11-27 DIAGNOSIS — Z85828 Personal history of other malignant neoplasm of skin: Principal | ICD-10-CM

## 2017-04-23 ENCOUNTER — Encounter
Admit: 2017-04-23 | Discharge: 2017-04-24 | Payer: PRIVATE HEALTH INSURANCE | Attending: Dermatology | Primary: Dermatology

## 2017-04-23 DIAGNOSIS — L739 Follicular disorder, unspecified: Secondary | ICD-10-CM

## 2017-04-23 DIAGNOSIS — L814 Other melanin hyperpigmentation: Secondary | ICD-10-CM

## 2017-04-23 DIAGNOSIS — D229 Melanocytic nevi, unspecified: Secondary | ICD-10-CM

## 2017-04-23 DIAGNOSIS — B079 Viral wart, unspecified: Secondary | ICD-10-CM

## 2017-04-23 DIAGNOSIS — Z85828 Personal history of other malignant neoplasm of skin: Secondary | ICD-10-CM

## 2017-04-23 DIAGNOSIS — L309 Dermatitis, unspecified: Principal | ICD-10-CM

## 2017-04-23 MED ORDER — HYDROCORTISONE 2.5 % TOPICAL OINTMENT
1 refills | 0 days | Status: CP
Start: 2017-04-23 — End: ?

## 2017-04-23 MED ORDER — FLUOROURACIL 5 % TOPICAL CREAM
0 refills | 0 days | Status: CP
Start: 2017-04-23 — End: 2017-11-23

## 2017-05-08 ENCOUNTER — Other Ambulatory Visit: Payer: Self-pay | Admitting: Internal Medicine

## 2017-05-08 DIAGNOSIS — Z1231 Encounter for screening mammogram for malignant neoplasm of breast: Secondary | ICD-10-CM

## 2017-05-21 ENCOUNTER — Encounter (INDEPENDENT_AMBULATORY_CARE_PROVIDER_SITE_OTHER): Payer: Self-pay

## 2017-05-21 ENCOUNTER — Encounter: Payer: Self-pay | Admitting: Hematology and Oncology

## 2017-05-21 ENCOUNTER — Inpatient Hospital Stay: Payer: Medicaid Other | Attending: Hematology and Oncology | Admitting: Hematology and Oncology

## 2017-05-21 ENCOUNTER — Inpatient Hospital Stay: Payer: Medicaid Other

## 2017-05-21 VITALS — BP 136/81 | HR 74 | Temp 97.3°F | Resp 18 | Ht 67.0 in | Wt 169.7 lb

## 2017-05-21 DIAGNOSIS — D649 Anemia, unspecified: Secondary | ICD-10-CM | POA: Insufficient documentation

## 2017-05-21 DIAGNOSIS — N920 Excessive and frequent menstruation with regular cycle: Secondary | ICD-10-CM | POA: Diagnosis not present

## 2017-05-21 DIAGNOSIS — D75839 Thrombocytosis, unspecified: Secondary | ICD-10-CM

## 2017-05-21 DIAGNOSIS — D473 Essential (hemorrhagic) thrombocythemia: Secondary | ICD-10-CM | POA: Insufficient documentation

## 2017-05-21 LAB — CBC WITH DIFFERENTIAL/PLATELET
BASOS PCT: 2 %
Basophils Absolute: 0.1 10*3/uL (ref 0–0.1)
Eosinophils Absolute: 0.1 10*3/uL (ref 0–0.7)
Eosinophils Relative: 2 %
HCT: 35.5 % (ref 35.0–47.0)
Hemoglobin: 12.2 g/dL (ref 12.0–16.0)
Lymphocytes Relative: 29 %
Lymphs Abs: 1.3 10*3/uL (ref 1.0–3.6)
MCH: 31.2 pg (ref 26.0–34.0)
MCHC: 34.3 g/dL (ref 32.0–36.0)
MCV: 90.9 fL (ref 80.0–100.0)
MONOS PCT: 7 %
Monocytes Absolute: 0.3 10*3/uL (ref 0.2–0.9)
Neutro Abs: 2.8 10*3/uL (ref 1.4–6.5)
Neutrophils Relative %: 62 %
Platelets: 400 10*3/uL (ref 150–440)
RBC: 3.9 MIL/uL (ref 3.80–5.20)
RDW: 13 % (ref 11.5–14.5)
WBC: 4.5 10*3/uL (ref 3.6–11.0)

## 2017-05-21 LAB — COMPREHENSIVE METABOLIC PANEL
ALK PHOS: 62 U/L (ref 38–126)
ALT: 17 U/L (ref 14–54)
ANION GAP: 9 (ref 5–15)
AST: 22 U/L (ref 15–41)
Albumin: 4.1 g/dL (ref 3.5–5.0)
BUN: 8 mg/dL (ref 6–20)
CALCIUM: 8.7 mg/dL — AB (ref 8.9–10.3)
CHLORIDE: 106 mmol/L (ref 101–111)
CO2: 23 mmol/L (ref 22–32)
Creatinine, Ser: 0.63 mg/dL (ref 0.44–1.00)
GFR calc Af Amer: >60 mL/min (ref >60)
GFR calc non Af Amer: >60 mL/min (ref >60)
Glucose, Bld: 84 mg/dL (ref 65–99)
Potassium: 3.8 mmol/L (ref 3.5–5.1)
SODIUM: 138 mmol/L (ref 135–145)
Total Bilirubin: 0.7 mg/dL (ref 0.3–1.2)
Total Protein: 6.9 g/dL (ref 6.5–8.1)

## 2017-05-21 LAB — C-REACTIVE PROTEIN

## 2017-05-21 LAB — IRON AND TIBC
Iron: 60 ug/dL (ref 28–170)
SATURATION RATIOS: 19 % (ref 10.4–31.8)
TIBC: 317 ug/dL (ref 250–450)
UIBC: 257 ug/dL

## 2017-05-21 LAB — FERRITIN: Ferritin: 18 ng/mL (ref 11–307)

## 2017-05-21 NOTE — Progress Notes (Signed)
[facility] day:  05/21/2017  Chief Complaint: Kristy Wallace is a 47 y.o. female with thrombocytosis who is referred in consultation by Dr. Kingsley Spittle for assessment and management.  HPI: Patient has known about thrombocytosis since a routine blood draw back in 2000. 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   Review of labs in Epic from 03/30/2011 - 11/20/2012 reveal a platelet count ranging from 401,000 - 684,000 without trend.  Platelet count trended higher with associated anemia.  Symptomatically, patient denies fevers, sweats, and weight loss. Patient denies bleeding; no hematochezia, melena, or gross hematuria. Patient eats meat on a daily basis. She infrequently eats green leafy vegetables. Patient denies ice pica and restless legs. Patient is having 2-3 days of heavy menses (10-12 tampons a day) before dropping off to "almost spotting".   Patient denies family history that is significant for any type of oncologic or hematologic disorders. Father passed away at age of 33 from a MI. Patient has 5 children, all in good health with the exception of her oldest daughter who has SLE.  Patient denies pain in the clinic.    Past Medical History:  Diagnosis Date  . Asthma    exercise induced asthma  . Bladder infection    hx of bladder infection  . Cancer ([place])    skin  . Chronic tonsillitis   . Frequent urination at night   . H/O nephrostomy ([place]) LEFT - PATENT  . HPV (human papilloma virus) infection   . Kidney stones   . Left ureteral calculus   . Nocturia   . Renal calculus, left   . Tonsillar hypertrophy     Past Surgical History:  Procedure Laterality Date  . colposcopy  2011  . CYSTOSCOPY/RETROGRADE/URETEROSCOPY  05/15/2011   Procedure: CYSTOSCOPY/RETROGRADE/URETEROSCOPY;  Surgeon: Bernestine Amass, MD;  Location: [facility];  Service:  Urology;  Laterality: Left;  . DILATION AND CURETTAGE OF UTERUS  1993  . NEPHROLITHOTOMY  04/24/2011   Procedure: FLEXIABLE CYSTOSCOPY/ NEPHROLITHOTOMY PERCUTANEOUS SECOND LOOK;  Surgeon: Bernestine Amass, MD;  Location: [facility];  Service: Urology;  Laterality: Left;  kidney       . PERCUTANEOUS NEPHROLITHOTOMY  04-03-11   LEFT KIDNEY STAGHORN STONE (DR GRAPEY AT [facility])  . TUBAL LIGATION  2003    Family History  Problem Relation Age of Onset  . Breast cancer Maternal Grandmother 62  . Lung cancer Maternal Grandmother   . Diabetes Mother   . Heart disease Mother   . Heart disease Paternal Grandmother   . Heart disease Paternal Grandfather     Social History:  reports that she quit smoking about 1 years ago. Her smoking use included cigarettes. She has a 6.00 pack-year smoking history. she has never used smokeless tobacco. She reports that she uses drugs. Drug: Marijuana. She reports that she does not drink alcohol. She smoked 1/2 - 1 pack/day x 18 years (quit 1.5 years ago).  Patient spent 6 years in the [employer]. Patient denies known exposures to radiation on toxins. 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 Patient has 5 children ages 1,17,20,21, and 66.  The patient is alone today.  Allergies:  Allergies  Allergen Reactions  . Adhesive [Tape] Itching and Rash    plastic  . Advair Diskus [Fluticasone-Salmeterol] Other (See Comments)    Flu-like symptoms.  . Azithromycin  Other (See Comments)    dizziness    Current Medications: Current Outpatient Medications  Medication Sig Dispense Refill  . fluorescein-benoxinate (FLURATE) ophthalmic solution Place 1 drop into both eyes once.    Marland Kitchen HYDROcodone-acetaminophen (NORCO/VICODIN) 5-325 MG per tablet Take 1-2 tablets by mouth every 6 (six) hours as needed for pain. (Patient not taking: Reported on 02/11/2014) 20 tablet 0   No current facility-administered medications for this visit.     Review of Systems:  GENERAL:   Feels good.  Active.  No fevers, sweats or weight loss. PERFORMANCE STATUS (ECOG):  0 HEENT:  No visual changes, runny nose, sore throat, mouth sores or tenderness. Lungs: No shortness of breath or cough.  No hemoptysis. Cardiac:  No chest pain, palpitations, orthopnea, or PND. GI:  No nausea, vomiting, diarrhea, constipation, melena or hematochezia. GU:  Heavy menses. No urgency, frequency, dysuria, or hematuria. Musculoskeletal:  No back pain.  No joint pain.  No muscle tenderness. Extremities:  No pain or swelling. Skin:  No rashes or skin changes. Neuro:  No headache, numbness or weakness, balance or coordination issues. Endocrine:  No diabetes, thyroid issues, hot flashes or night sweats. Psych:  No mood changes, depression or anxiety. Pain:  No focal pain. Review of systems:  All other systems reviewed and found to be negative.  Physical Exam: Blood pressure 136/81, pulse 74, temperature (!) 97.3 F (36.3 C), temperature source Tympanic, resp. rate 18, height 5' 7"  (1.702 m), weight 169 lb 11.2 oz (77 kg), SpO2 100 %. GENERAL:  Well developed, well nourished, woman sitting comfortably in the exam room in no acute distress. MENTAL STATUS:  Alert and oriented to person, place and time. HEAD:  Long brown hair with graying.  Normocephalic, atraumatic, face symmetric, no Cushingoid features. EYES:  Glasses. Brown eyes.  Pupils equal round and reactive to light and accomodation.  No conjunctivitis or scleral icterus. ENT:  Oropharynx clear without lesion.  Tongue normal. Mucous membranes moist.  RESPIRATORY:  Clear to auscultation without rales, wheezes or rhonchi. CARDIOVASCULAR:  Regular rate and rhythm without murmur, rub or gallop. ABDOMEN:  Soft, non-tender, with active bowel sounds, and no hepatosplenomegaly.  No masses. SKIN:  No rashes, ulcers or lesions. EXTREMITIES: No edema, no skin discoloration or tenderness.  No palpable cords. LYMPH NODES: No palpable cervical,  supraclavicular, axillary or inguinal adenopathy  NEUROLOGICAL: Unremarkable. PSYCH:  Appropriate.   No visits with results within 3 Day(s) from this visit.  Latest known visit with results is:  Admission on 09/30/2012, Discharged on 09/30/2012  Component Date Value Ref Range Status  . Preg Test, Ur 09/30/2012 NEGATIVE  NEGATIVE Final   Comment:                                 THE SENSITIVITY OF THIS                          METHODOLOGY IS >20 mIU/mL.    Assessment:  Kristy Wallace is a 47 y.o. female with thrombocytosis dating back to 2000.  Platelet count from 03/2011 - 05/2017 has ranged between 401,000 - 684,000 without trend.  Menses appears moderate.  Diet is good.  Symptomatically, she feels well.  Exam is unremarkable.  Plan: 1.  Labs today: CBC with diff, CMP, ferritin, iron studies, ESR, CRP, JAK 2 with reflex to CALR, exon 12-15, and MPL. 2.  Discuss  thrombocytosis diagnosis. Review primary vs. secondary etiologies.  3.  RTC in 2 weeks for MD assessment and review of workup.    Honor Loh, NP  05/21/2017, 12:36 PM   I saw and evaluated the patient, participating in the key portions of the service and reviewing pertinent diagnostic studies and records.  I reviewed the nurse practitioner's note and agree with the findings and the plan.  The assessment and plan were discussed with the patient.  Several questions were asked by the patient and answered.   Nolon Stalls, MD 05/21/2017, 12:36 PM

## 2017-05-23 ENCOUNTER — Other Ambulatory Visit: Payer: Self-pay | Admitting: *Deleted

## 2017-05-23 DIAGNOSIS — D75839 Thrombocytosis, unspecified: Secondary | ICD-10-CM

## 2017-05-23 DIAGNOSIS — D473 Essential (hemorrhagic) thrombocythemia: Secondary | ICD-10-CM

## 2017-05-24 ENCOUNTER — Other Ambulatory Visit: Payer: Self-pay | Admitting: *Deleted

## 2017-05-24 DIAGNOSIS — D473 Essential (hemorrhagic) thrombocythemia: Secondary | ICD-10-CM

## 2017-05-24 DIAGNOSIS — D75839 Thrombocytosis, unspecified: Secondary | ICD-10-CM

## 2017-05-28 LAB — CALR + JAK2 E12-15 + MPL (REFLEXED)

## 2017-05-28 LAB — JAK2 V617F, W REFLEX TO CALR/E12/MPL

## 2017-06-04 ENCOUNTER — Inpatient Hospital Stay: Payer: Medicaid Other | Attending: Hematology and Oncology | Admitting: Hematology and Oncology

## 2017-06-04 ENCOUNTER — Encounter: Payer: Self-pay | Admitting: Hematology and Oncology

## 2017-06-04 ENCOUNTER — Inpatient Hospital Stay: Payer: Medicaid Other

## 2017-06-04 VITALS — BP 154/78 | HR 94 | Temp 97.5°F | Resp 16 | Wt 162.1 lb

## 2017-06-04 DIAGNOSIS — E611 Iron deficiency: Secondary | ICD-10-CM | POA: Diagnosis not present

## 2017-06-04 DIAGNOSIS — D473 Essential (hemorrhagic) thrombocythemia: Secondary | ICD-10-CM | POA: Diagnosis not present

## 2017-06-04 DIAGNOSIS — D75839 Thrombocytosis, unspecified: Secondary | ICD-10-CM

## 2017-06-04 LAB — SEDIMENTATION RATE: Sed Rate: 3 mm/hr (ref 0–20)

## 2017-06-04 NOTE — Progress Notes (Signed)
Pt in for follow up, denies any difficulties or concerns.  

## 2017-06-04 NOTE — Progress Notes (Signed)
[facility] day:  06/04/2017  Chief Complaint: Kristy Wallace is a 47 y.o. female with thrombocytosis who is seen for review of work-up and discussion regarding direction of therapy.  HPI:  The patient was last seen in the hematology clinic on 05/21/2017 for initial consultation.  She had thrombocytosis dating back to 2000.  Platelet count dating back to 03/2011 ranged between 401,000 - 684,000 without trend.  Menses appeared moderate.  Diet was good.  Work-up revealed a hematocrit of 35.5, hemoglobin 12.2, MCV 90.9, platelets 400,000, WBC 4500 with an ANC of 2800.  Differential was unremarkable.  Ferritin was 18 with an iron saturation of 19% and a TIBC of 317.  CMP was normal.  CRP was normal.  JAK2 V617F, exon 12-15, CALR, and MPL were negative.  During the interim, she has done well.  She denies any concerns.   Past Medical History:  Diagnosis Date  . Asthma    exercise induced asthma  . Bladder infection    hx of bladder infection  . Cancer ([place])    skin  . Chronic tonsillitis   . Frequent urination at night   . H/O nephrostomy LEFT - PATENT  . HPV (human papilloma virus) infection   . Kidney stones   . Left ureteral calculus   . Nocturia   . Renal calculus, left   . Tonsillar hypertrophy     Past Surgical History:  Procedure Laterality Date  . colposcopy  2011  . CYSTOSCOPY/RETROGRADE/URETEROSCOPY  05/15/2011   Procedure: CYSTOSCOPY/RETROGRADE/URETEROSCOPY;  Surgeon: Bernestine Amass, MD;  Location: [facility];  Service: Urology;  Laterality: Left;  . DILATION AND CURETTAGE OF UTERUS  1993  . NEPHROLITHOTOMY  04/24/2011   Procedure: FLEXIABLE CYSTOSCOPY/ NEPHROLITHOTOMY PERCUTANEOUS SECOND LOOK;  Surgeon: Bernestine Amass, MD;  Location: [facility];  Service: Urology;  Laterality: Left;  kidney       . PERCUTANEOUS NEPHROLITHOTOMY  04-03-11   LEFT KIDNEY STAGHORN STONE (DR GRAPEY AT [facility])  . TUBAL LIGATION  2003     Family History  Problem Relation Age of Onset  . Breast cancer Maternal Grandmother 63  . Lung cancer Maternal Grandmother   . Diabetes Mother   . Heart disease Mother   . Heart disease Paternal Grandmother   . Heart disease Paternal Grandfather     Social History:  reports that she quit smoking about 2 years ago. Her smoking use included cigarettes. She has a 6.00 pack-year smoking history. She has never used smokeless tobacco. She reports that she has current or past drug history. Drug: Marijuana. She reports that she does not drink alcohol. She smoked 1/2 - 1 pack/day x 18 years (quit 1.5 years ago).  Patient spent 6 years in the [employer]. Patient denies known exposures to radiation on toxins. 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  The patient is accompanied by her fiance', Kristy Wallace, today.  Allergies:  Allergies  Allergen Reactions  . Adhesive [Tape] Itching and Rash    plastic  . Advair Diskus [Fluticasone-Salmeterol] Other (See Comments)    Flu-like symptoms.  . Azithromycin Other (See Comments)    dizziness    Current Medications: No current outpatient medications on file.   No current facility-administered medications for this visit.     Review of Systems:  GENERAL:  Feels good.  Active.  No fevers, sweats or weight loss. PERFORMANCE STATUS (ECOG):  0 HEENT:  No visual changes, runny nose, sore throat, mouth sores or tenderness. Lungs: No shortness of breath or cough.  No hemoptysis. Cardiac:  No chest pain, palpitations, orthopnea, or PND. GI:  No nausea, vomiting, diarrhea, constipation, melena or hematochezia. GU:  Heavy menses. No urgency, frequency, dysuria, or hematuria. Musculoskeletal:  No back pain.  No joint pain.  No muscle tenderness. Extremities:  No pain or swelling. Skin:  No rashes or skin changes. Neuro:  No headache, numbness or weakness, balance or coordination  issues. Endocrine:  No diabetes, thyroid issues, hot flashes or night sweats. Psych:  No mood changes, depression or anxiety. Pain:  No focal pain. Review of systems:  All other systems reviewed and found to be negative.  Physical Exam: Blood pressure (!) 154/78, pulse 94, temperature (!) 97.5 F (36.4 C), temperature source Tympanic, resp. rate 16, weight 162 lb 1 oz (73.5 kg). GENERAL:  Well developed, well nourished, woman sitting comfortably in the exam room in no acute distress. MENTAL STATUS:  Alert and oriented to person, place and time. HEAD:  Long brown hair with graying.  Normocephalic, atraumatic, face symmetric, no Cushingoid features. EYES:  Glasses. Brown eyes. No conjunctivitis or scleral icterus. NEUROLOGICAL: Unremarkable. PSYCH:  Appropriate.   Appointment on 06/04/2017  Component Date Value Ref Range Status  . Sed Rate 06/04/2017 3  0 - 20 mm/hr Final   Performed at [facility], [address] [place],  [address]    Assessment:  Kristy Wallace is a 47 y.o. female with thrombocytosis dating back to 2000.  Platelet count from 03/2011 - 05/2017 has ranged between 401,000 - 684,000 without trend.  Menses appears moderate.  Diet is good.  Work-up on 05/21/2017 revealed a hematocrit of 35.5, hemoglobin 12.2, MCV 90.9, platelets 400,000, WBC 4500 with an ANC of 2800.  Differential was unremarkable.  Ferritin was 18 with an iron saturation of 19% and a TIBC of 317.  CMP and CRP were normal.  JAK2 V617F, exon 12-15, CALR, and MPL were negative.  Symptomatically, she feels well.  Exam is unremarkable.  Plan: 1.  Review work-up.  No evidence of a myeloproliferative disorder.  Discuss iron deficiency and likely reactive thrombocytosis. 2.  Discuss iron rich food and supplemental iron. 3.  RTC in 2 months for labs (CBC, ferritin) 4.  RTC in 4 months for MD assessment and labs (CBC, ferritin).   Lequita Asal, MD  06/04/2017, 4:35 PM

## 2017-07-02 ENCOUNTER — Ambulatory Visit
Admission: RE | Admit: 2017-07-02 | Discharge: 2017-07-02 | Disposition: A | Discharge status: Home / Self Care | Payer: Medicaid Other | Source: Ambulatory Visit | Attending: Internal Medicine | Admitting: Internal Medicine

## 2017-07-02 DIAGNOSIS — Z1231 Encounter for screening mammogram for malignant neoplasm of breast: Secondary | ICD-10-CM | POA: Insufficient documentation

## 2017-08-10 ENCOUNTER — Other Ambulatory Visit: Payer: Self-pay

## 2017-08-10 ENCOUNTER — Inpatient Hospital Stay: Payer: Medicaid Other | Attending: Hematology and Oncology

## 2017-08-10 DIAGNOSIS — D473 Essential (hemorrhagic) thrombocythemia: Secondary | ICD-10-CM | POA: Diagnosis present

## 2017-08-10 DIAGNOSIS — D75839 Thrombocytosis, unspecified: Secondary | ICD-10-CM

## 2017-08-10 LAB — CBC WITH DIFFERENTIAL/PLATELET
Basophils Absolute: 0.1 10*3/uL (ref 0–0.1)
Basophils Relative: 2 %
Eosinophils Absolute: 0.2 10*3/uL (ref 0–0.7)
Eosinophils Relative: 4 %
HCT: 36.6 % (ref 35.0–47.0)
Hemoglobin: 12.6 g/dL (ref 12.0–16.0)
Lymphocytes Relative: 30 %
Lymphs Abs: 1.5 10*3/uL (ref 1.0–3.6)
MCH: 31 pg (ref 26.0–34.0)
MCHC: 34.4 g/dL (ref 32.0–36.0)
MCV: 90.1 fL (ref 80.0–100.0)
Monocytes Absolute: 0.4 10*3/uL (ref 0.2–0.9)
Monocytes Relative: 8 %
Neutro Abs: 2.7 10*3/uL (ref 1.4–6.5)
Neutrophils Relative %: 56 %
Platelets: 410 10*3/uL (ref 150–440)
RBC: 4.06 MIL/uL (ref 3.80–5.20)
RDW: 13.2 % (ref 11.5–14.5)
WBC: 4.9 10*3/uL (ref 3.6–11.0)

## 2017-08-10 LAB — FERRITIN: Ferritin: 22 ng/mL (ref 11–307)

## 2017-10-05 ENCOUNTER — Encounter: Payer: Self-pay | Admitting: Hematology and Oncology

## 2017-10-05 ENCOUNTER — Inpatient Hospital Stay: Payer: Medicaid Other | Attending: Hematology and Oncology

## 2017-10-05 ENCOUNTER — Inpatient Hospital Stay (HOSPITAL_BASED_OUTPATIENT_CLINIC_OR_DEPARTMENT_OTHER): Payer: Medicaid Other | Admitting: Hematology and Oncology

## 2017-10-05 ENCOUNTER — Telehealth: Payer: Self-pay | Admitting: *Deleted

## 2017-10-05 VITALS — BP 114/73 | HR 55 | Temp 96.0°F | Resp 18 | Wt 149.1 lb

## 2017-10-05 DIAGNOSIS — D473 Essential (hemorrhagic) thrombocythemia: Secondary | ICD-10-CM | POA: Insufficient documentation

## 2017-10-05 DIAGNOSIS — N920 Excessive and frequent menstruation with regular cycle: Secondary | ICD-10-CM | POA: Insufficient documentation

## 2017-10-05 DIAGNOSIS — D75839 Thrombocytosis, unspecified: Secondary | ICD-10-CM

## 2017-10-05 DIAGNOSIS — D509 Iron deficiency anemia, unspecified: Secondary | ICD-10-CM

## 2017-10-05 LAB — SEDIMENTATION RATE: Sed Rate: 4 mm/hr (ref 0–20)

## 2017-10-05 LAB — CBC WITH DIFFERENTIAL/PLATELET
Basophils Absolute: 0.1 10*3/uL (ref 0–0.1)
Basophils Relative: 2 %
Eosinophils Absolute: 0.2 10*3/uL (ref 0–0.7)
Eosinophils Relative: 4 %
HCT: 37.4 % (ref 35.0–47.0)
Hemoglobin: 12.5 g/dL (ref 12.0–16.0)
Lymphocytes Relative: 24 %
Lymphs Abs: 1.3 10*3/uL (ref 1.0–3.6)
MCH: 30.7 pg (ref 26.0–34.0)
MCHC: 33.4 g/dL (ref 32.0–36.0)
MCV: 91.7 fL (ref 80.0–100.0)
Monocytes Absolute: 0.4 10*3/uL (ref 0.2–0.9)
Monocytes Relative: 8 %
Neutro Abs: 3.4 10*3/uL (ref 1.4–6.5)
Neutrophils Relative %: 62 %
Platelets: 472 10*3/uL — ABNORMAL HIGH (ref 150–440)
RBC: 4.07 MIL/uL (ref 3.80–5.20)
RDW: 14 % (ref 11.5–14.5)
WBC: 5.5 10*3/uL (ref 3.6–11.0)

## 2017-10-05 LAB — FERRITIN: Ferritin: 8 ng/mL — ABNORMAL LOW (ref 11–307)
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
## 2017-10-05 NOTE — Progress Notes (Signed)
Patient offers no complaints today. 

## 2017-10-05 NOTE — Telephone Encounter (Signed)
-----   Message from Lequita Asal, MD sent at 10/05/2017  2:20 PM EDT ----- Regarding: Please call patient  Ferritin level 8.  Ensure she is taking iron and eating iron rich foods.  M ----- Message ----- From: Interface, Lab In [place] Sent: 10/05/2017  10:49 AM To: Lequita Asal, MD

## 2017-10-05 NOTE — Telephone Encounter (Signed)
Called patient to inform her that her ferritin level is low.  She states she has not been taking her iron, but will start it again.  Encouraged her to eat iron rich foods as well.

## 2017-11-06 ENCOUNTER — Other Ambulatory Visit: Payer: Self-pay | Admitting: *Deleted

## 2017-11-06 DIAGNOSIS — D473 Essential (hemorrhagic) thrombocythemia: Secondary | ICD-10-CM

## 2017-11-06 DIAGNOSIS — D509 Iron deficiency anemia, unspecified: Secondary | ICD-10-CM

## 2017-11-06 DIAGNOSIS — D75839 Thrombocytosis, unspecified: Secondary | ICD-10-CM

## 2017-11-09 ENCOUNTER — Inpatient Hospital Stay: Payer: Medicaid Other | Attending: Hematology and Oncology

## 2017-11-09 ENCOUNTER — Telehealth: Payer: Self-pay | Admitting: *Deleted

## 2017-11-09 ENCOUNTER — Other Ambulatory Visit: Payer: Self-pay

## 2017-11-09 DIAGNOSIS — D473 Essential (hemorrhagic) thrombocythemia: Secondary | ICD-10-CM | POA: Insufficient documentation

## 2017-11-09 DIAGNOSIS — D509 Iron deficiency anemia, unspecified: Secondary | ICD-10-CM | POA: Insufficient documentation

## 2017-11-09 LAB — CBC WITH DIFFERENTIAL/PLATELET
Basophils Absolute: 0.1 10*3/uL (ref 0–0.1)
Basophils Relative: 1 %
Eosinophils Absolute: 0.1 10*3/uL (ref 0–0.7)
Eosinophils Relative: 2 %
HCT: 40.5 % (ref 35.0–47.0)
Hemoglobin: 13.6 g/dL (ref 12.0–16.0)
Lymphocytes Relative: 26 %
Lymphs Abs: 1.2 10*3/uL (ref 1.0–3.6)
MCH: 31.1 pg (ref 26.0–34.0)
MCHC: 33.6 g/dL (ref 32.0–36.0)
MCV: 92.6 fL (ref 80.0–100.0)
Monocytes Absolute: 0.4 10*3/uL (ref 0.2–0.9)
Monocytes Relative: 8 %
Neutro Abs: 3 10*3/uL (ref 1.4–6.5)
Neutrophils Relative %: 63 %
Platelets: 466 10*3/uL — ABNORMAL HIGH (ref 150–440)
RBC: 4.37 MIL/uL (ref 3.80–5.20)
RDW: 13.7 % (ref 11.5–14.5)
WBC: 4.7 10*3/uL (ref 3.6–11.0)

## 2017-11-09 LAB — FERRITIN: Ferritin: 27 ng/mL (ref 11–307)

## 2017-11-09 NOTE — Telephone Encounter (Signed)
Called patient to inform her that her ferritin is slowly improving.  Per MD continue oral iron and iron rich foods.

## 2017-11-09 NOTE — Telephone Encounter (Signed)
-----   Message from Lequita Asal, MD sent at 11/09/2017  1:11 PM EDT ----- Regarding: Please call patient  Continue iron and iron rich foods.  Ferritin slowly improving.  M  ----- Message ----- From: Buel Ream, Lab In [place] Sent: 11/09/2017   9:19 AM EDT To: Lequita Asal, MD

## 2017-11-23 ENCOUNTER — Encounter
Admit: 2017-11-23 | Discharge: 2017-11-24 | Payer: PRIVATE HEALTH INSURANCE | Attending: Dermatology | Primary: Dermatology

## 2017-11-23 DIAGNOSIS — L439 Lichen planus, unspecified: Principal | ICD-10-CM

## 2017-11-23 DIAGNOSIS — B079 Viral wart, unspecified: Secondary | ICD-10-CM

## 2017-11-23 DIAGNOSIS — Z85828 Personal history of other malignant neoplasm of skin: Secondary | ICD-10-CM

## 2017-11-23 DIAGNOSIS — L821 Other seborrheic keratosis: Secondary | ICD-10-CM

## 2017-11-23 MED ORDER — FLUOCINONIDE 0.05 % TOPICAL OINTMENT
Freq: Two times a day (BID) | TOPICAL | 0 refills | 0 days | Status: CP
Start: 2017-11-23 — End: 2018-11-23

## 2018-04-05 ENCOUNTER — Other Ambulatory Visit: Payer: Self-pay

## 2018-04-05 ENCOUNTER — Inpatient Hospital Stay: Payer: Medicaid Other | Attending: Hematology and Oncology

## 2018-04-05 DIAGNOSIS — D473 Essential (hemorrhagic) thrombocythemia: Secondary | ICD-10-CM

## 2018-04-05 DIAGNOSIS — D75839 Thrombocytosis, unspecified: Secondary | ICD-10-CM

## 2018-04-05 LAB — CBC WITH DIFFERENTIAL/PLATELET
Abs Immature Granulocytes: 0.02 10*3/uL (ref 0.00–0.07)
Basophils Absolute: 0.1 10*3/uL (ref 0.0–0.1)
Basophils Relative: 1 %
Eosinophils Absolute: 0.1 10*3/uL (ref 0.0–0.5)
Eosinophils Relative: 2 %
HCT: 39.9 % (ref 36.0–46.0)
Hemoglobin: 12.9 g/dL (ref 12.0–15.0)
Immature Granulocytes: 0 %
Lymphocytes Relative: 38 %
Lymphs Abs: 2.4 10*3/uL (ref 0.7–4.0)
MCH: 30.1 pg (ref 26.0–34.0)
MCHC: 32.3 g/dL (ref 30.0–36.0)
MCV: 93 fL (ref 80.0–100.0)
Monocytes Absolute: 0.6 10*3/uL (ref 0.1–1.0)
Monocytes Relative: 10 %
Neutro Abs: 3.1 10*3/uL (ref 1.7–7.7)
Neutrophils Relative %: 49 %
Platelets: 494 10*3/uL — ABNORMAL HIGH (ref 150–400)
RBC: 4.29 MIL/uL (ref 3.87–5.11)
RDW: 12.9 % (ref 11.5–15.5)
WBC: 6.3 10*3/uL (ref 4.0–10.5)
nRBC: 0 % (ref 0.0–0.2)

## 2018-04-05 LAB — FERRITIN: Ferritin: 38 ng/mL (ref 11–307)

## 2018-04-08 ENCOUNTER — Telehealth: Payer: Self-pay

## 2018-04-08 NOTE — Telephone Encounter (Signed)
-----   Message from Lequita Asal, MD sent at 04/06/2018  6:58 AM EST ----- Regarding: Please call patient  Review labs.  Continue oral iron.  M ----- Message ----- From: Buel Ream, Lab In [place] Sent: 04/05/2018   2:43 PM EST To: Lequita Asal, MD

## 2018-04-08 NOTE — Telephone Encounter (Signed)
Left VM informing patient Kristy Wallace. Callback number provided if any questions or concerns arise.

## 2018-10-03 ENCOUNTER — Other Ambulatory Visit: Payer: Self-pay | Admitting: Internal Medicine

## 2018-10-03 DIAGNOSIS — Z1231 Encounter for screening mammogram for malignant neoplasm of breast: Secondary | ICD-10-CM

## 2018-10-10 ENCOUNTER — Other Ambulatory Visit: Payer: Self-pay
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
## 2018-10-11 ENCOUNTER — Other Ambulatory Visit: Payer: Medicaid Other

## 2018-10-11 ENCOUNTER — Telehealth: Payer: Self-pay

## 2018-10-11 ENCOUNTER — Encounter: Payer: Self-pay | Admitting: Hematology and Oncology

## 2018-10-11 ENCOUNTER — Inpatient Hospital Stay: Payer: Medicaid Other | Attending: Hematology and Oncology

## 2018-10-11 ENCOUNTER — Ambulatory Visit: Payer: Medicaid Other | Admitting: Hematology and Oncology

## 2018-10-11 ENCOUNTER — Inpatient Hospital Stay (HOSPITAL_BASED_OUTPATIENT_CLINIC_OR_DEPARTMENT_OTHER): Payer: Medicaid Other | Admitting: Hematology and Oncology

## 2018-10-11 ENCOUNTER — Other Ambulatory Visit: Payer: Self-pay

## 2018-10-11 VITALS — BP 147/85 | HR 95 | Temp 96.9°F | Resp 18 | Wt 144.6 lb

## 2018-10-11 DIAGNOSIS — D509 Iron deficiency anemia, unspecified: Secondary | ICD-10-CM

## 2018-10-11 DIAGNOSIS — D473 Essential (hemorrhagic) thrombocythemia: Secondary | ICD-10-CM | POA: Insufficient documentation

## 2018-10-11 DIAGNOSIS — D75839 Thrombocytosis, unspecified: Secondary | ICD-10-CM

## 2018-10-11 DIAGNOSIS — N920 Excessive and frequent menstruation with regular cycle: Secondary | ICD-10-CM | POA: Diagnosis not present

## 2018-10-11 DIAGNOSIS — E611 Iron deficiency: Secondary | ICD-10-CM | POA: Diagnosis not present

## 2018-10-11 LAB — CBC WITH DIFFERENTIAL/PLATELET
Abs Immature Granulocytes: 0.01 10*3/uL (ref 0.00–0.07)
Basophils Absolute: 0.1 10*3/uL (ref 0.0–0.1)
Basophils Relative: 1 %
Eosinophils Absolute: 0.2 10*3/uL (ref 0.0–0.5)
Eosinophils Relative: 2 %
HCT: 38.2 % (ref 36.0–46.0)
Hemoglobin: 13 g/dL (ref 12.0–15.0)
Immature Granulocytes: 0 %
Lymphocytes Relative: 29 %
Lymphs Abs: 1.9 10*3/uL (ref 0.7–4.0)
MCH: 31 pg (ref 26.0–34.0)
MCHC: 34 g/dL (ref 30.0–36.0)
MCV: 91.2 fL (ref 80.0–100.0)
Monocytes Absolute: 0.5 10*3/uL (ref 0.1–1.0)
Monocytes Relative: 8 %
Neutro Abs: 3.8 10*3/uL (ref 1.7–7.7)
Neutrophils Relative %: 60 %
Platelets: 376 10*3/uL (ref 150–400)
RBC: 4.19 MIL/uL (ref 3.87–5.11)
RDW: 13 % (ref 11.5–15.5)
WBC: 6.3 10*3/uL (ref 4.0–10.5)
nRBC: 0 % (ref 0.0–0.2)

## 2018-10-11 LAB — COMPREHENSIVE METABOLIC PANEL
ALT: 43 U/L (ref 0–44)
AST: 106 U/L — ABNORMAL HIGH (ref 15–41)
Albumin: 4.5 g/dL (ref 3.5–5.0)
Alkaline Phosphatase: 57 U/L (ref 38–126)
Anion gap: 7 (ref 5–15)
BUN: 11 mg/dL (ref 6–20)
CO2: 25 mmol/L (ref 22–32)
Calcium: 8.9 mg/dL (ref 8.9–10.3)
Chloride: 103 mmol/L (ref 98–111)
Creatinine, Ser: 0.59 mg/dL (ref 0.44–1.00)
GFR calc Af Amer: >60 mL/min (ref >60)
GFR calc non Af Amer: >60 mL/min (ref >60)
Glucose, Bld: 92 mg/dL (ref 70–99)
Potassium: 3.3 mmol/L — ABNORMAL LOW (ref 3.5–5.1)
Sodium: 135 mmol/L (ref 135–145)
Total Bilirubin: 0.7 mg/dL (ref 0.3–1.2)
Total Protein: 7.2 g/dL (ref 6.5–8.1)

## 2018-10-11 LAB — FERRITIN: Ferritin: 78 ng/mL (ref 11–307)

## 2018-10-11 NOTE — Telephone Encounter (Signed)
-----   Message from Lequita Asal, MD sent at 10/11/2018  4:11 PM EDT ----- Regarding: Please call patient  Potassium 3.3 (slightly low).  Liver function tests slightly elevated.  Recommend foods rich in potassium (bananas and baked potatoes).  Liver function tests need to be repeated next week with addition of hepatitis testing.  Any recent alcohol or new medication?  M ----- Message ----- From: Buel Ream, Lab In [place] Sent: 10/11/2018   1:37 PM EDT To: Lequita Asal, MD

## 2018-10-11 NOTE — Telephone Encounter (Signed)
Spoke with the patient to inform her that her potassium was slightly low at 3.3 . The patient liver function test slightly elevated and liver function test need to be repeat next week with addition of hepatitis testing.I have recommend food rich in potassium ( bananas and baked potatoes.. The patient states she has not had any recent alcohol or new medication. I have schedule the patient for 10/17/2018. The patient was understanding and agreeable.

## 2018-10-11 NOTE — Progress Notes (Signed)
No new changes noted today 

## 2018-10-17 ENCOUNTER — Inpatient Hospital Stay: Payer: Medicaid Other

## 2018-10-17 ENCOUNTER — Other Ambulatory Visit: Payer: Self-pay

## 2018-10-17 DIAGNOSIS — D473 Essential (hemorrhagic) thrombocythemia: Secondary | ICD-10-CM

## 2018-10-17 DIAGNOSIS — D75839 Thrombocytosis, unspecified: Secondary | ICD-10-CM

## 2018-10-17 LAB — HEPATIC FUNCTION PANEL
ALT: 23 U/L (ref 0–44)
AST: 25 U/L (ref 15–41)
Albumin: 4.4 g/dL (ref 3.5–5.0)
Alkaline Phosphatase: 47 U/L (ref 38–126)
Bilirubin, Direct: <0.1 mg/dL (ref 0.0–0.2)
Total Bilirubin: 0.6 mg/dL (ref 0.3–1.2)
Total Protein: 7.2 g/dL (ref 6.5–8.1)

## 2018-10-18 ENCOUNTER — Telehealth: Payer: Self-pay

## 2018-10-18 LAB — HEPATITIS PANEL, ACUTE
HCV Ab: 0.2 s/co ratio (ref 0.0–0.9)
Hep A IgM: NEGATIVE
Hep B C IgM: NEGATIVE
Hepatitis B Surface Ag: NEGATIVE

## 2018-10-18 LAB — HEPATITIS B CORE ANTIBODY, TOTAL: Hep B Core Total Ab: NEGATIVE

## 2018-10-18 NOTE — Telephone Encounter (Signed)
-----   Message from Lequita Asal, MD sent at 10/17/2018  5:20 PM EDT ----- Regarding: Please call patient  LFTs are normal.  M ----- Message ----- From: Interface, Lab In [place] Sent: 10/17/2018   5:03 PM EDT To: Lequita Asal, MD
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
## 2018-11-06 ENCOUNTER — Ambulatory Visit: Payer: Medicaid Other | Attending: Internal Medicine

## 2019-09-22 ENCOUNTER — Other Ambulatory Visit: Payer: Self-pay | Admitting: Internal Medicine

## 2019-09-22 DIAGNOSIS — Z1231 Encounter for screening mammogram for malignant neoplasm of breast: Secondary | ICD-10-CM

## 2019-10-08 ENCOUNTER — Other Ambulatory Visit: Payer: Self-pay

## 2019-10-08 ENCOUNTER — Ambulatory Visit
Admission: RE | Admit: 2019-10-08 | Discharge: 2019-10-08 | Disposition: A | Discharge status: Home / Self Care | Payer: 59 | Source: Ambulatory Visit | Attending: Internal Medicine | Admitting: Internal Medicine

## 2019-10-08 DIAGNOSIS — Z1231 Encounter for screening mammogram for malignant neoplasm of breast: Secondary | ICD-10-CM | POA: Insufficient documentation

## 2019-10-23 ENCOUNTER — Emergency Department
Admission: EM | Admit: 2019-10-23 | Discharge: 2019-10-23 | Disposition: A | Discharge status: Home / Self Care | Payer: No Typology Code available for payment source | Attending: Emergency Medicine | Admitting: Emergency Medicine

## 2019-10-23 ENCOUNTER — Other Ambulatory Visit: Payer: Self-pay

## 2019-10-23 ENCOUNTER — Encounter: Payer: Self-pay | Admitting: Emergency Medicine

## 2019-10-23 ENCOUNTER — Ambulatory Visit: Payer: No Typology Code available for payment source

## 2019-10-23 DIAGNOSIS — Z87891 Personal history of nicotine dependence: Secondary | ICD-10-CM | POA: Diagnosis not present

## 2019-10-23 DIAGNOSIS — J45909 Unspecified asthma, uncomplicated: Secondary | ICD-10-CM | POA: Diagnosis not present

## 2019-10-23 DIAGNOSIS — W19XXXA Unspecified fall, initial encounter: Secondary | ICD-10-CM | POA: Insufficient documentation

## 2019-10-23 DIAGNOSIS — S5001XA Contusion of right elbow, initial encounter: Secondary | ICD-10-CM | POA: Insufficient documentation

## 2019-10-23 DIAGNOSIS — Y999 Unspecified external cause status: Secondary | ICD-10-CM | POA: Insufficient documentation

## 2019-10-23 DIAGNOSIS — M25521 Pain in right elbow: Secondary | ICD-10-CM | POA: Insufficient documentation

## 2019-10-23 DIAGNOSIS — S59901A Unspecified injury of right elbow, initial encounter: Secondary | ICD-10-CM | POA: Diagnosis present

## 2019-10-23 DIAGNOSIS — W010XXA Fall on same level from slipping, tripping and stumbling without subsequent striking against object, initial encounter: Secondary | ICD-10-CM | POA: Diagnosis not present

## 2019-10-23 DIAGNOSIS — Y939 Activity, unspecified: Secondary | ICD-10-CM | POA: Insufficient documentation

## 2019-10-23 DIAGNOSIS — S3992XA Unspecified injury of lower back, initial encounter: Secondary | ICD-10-CM | POA: Diagnosis not present

## 2019-10-23 DIAGNOSIS — Y929 Unspecified place or not applicable: Secondary | ICD-10-CM | POA: Insufficient documentation

## 2019-10-23 NOTE — Discharge Instructions (Signed)
Please alternate ibuprofen and Tylenol for pain and soreness.  You may take 600 mg of ibuprofen every 6-8 hours.  You may take 500 mg of Tylenol every 4-6 hours as needed.

## 2019-10-23 NOTE — ED Triage Notes (Signed)
Pt reports slipped on water at work and fell hurting her right elbow and lower back.

## 2019-10-23 NOTE — ED Notes (Signed)
See triage note  States she slipped in water at work  [place]  Having pain to tailbone and right elbow  Ambulates slowly d/t pain

## 2019-10-23 NOTE — ED Provider Notes (Signed)
[facility] Emergency Department Provider Note  ____________________________________________   First MD Initiated Contact with Patient 10/23/19 1144     (approximate)  I have reviewed the triage vital signs and the nursing notes.   HISTORY  Chief Complaint Fall, Arm Injury, and Back Pain   HPI Kristy Wallace is a 49 y.o. female presents to the emergency department following a fall at work.  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  The patient states she was carrying several large baking sheets when she slipped presumably on water, and fell to the floor.  She states that she caught herself with her tailbone and right elbow.  Denies hitting head, denies loss of consciousness, denies headache or neck pain.  After a few minutes, the patient was able to get up and ambulate.  Pain is made worse with ambulation and movement of the right elbow.  Pain is relieved with laying on left side.  She has not had any treatments up until this point for her pain.        Past Medical History:  Diagnosis Date   Asthma    exercise induced asthma   Bladder infection    hx of bladder infection   Cancer ([place])    skin   Chronic tonsillitis    Frequent urination at night    H/O nephrostomy LEFT - PATENT   HPV (human papilloma virus) infection    Kidney stones    Left ureteral calculus    Nocturia    Renal calculus, left    Tonsillar hypertrophy     Patient Active Problem List   Diagnosis Date Noted   Iron deficiency anemia 10/11/2018   Thrombocytosis ([place]) 05/21/2017    Past Surgical History:  Procedure Laterality Date   colposcopy  2011   CYSTOSCOPY/RETROGRADE/URETEROSCOPY  05/15/2011   Procedure: CYSTOSCOPY/RETROGRADE/URETEROSCOPY;  Surgeon: Bernestine Amass, MD;  Location: [facility];  Service: Urology;  Laterality: Left;   DILATION AND CURETTAGE OF UTERUS  1993   NEPHROLITHOTOMY  04/24/2011   Procedure: FLEXIABLE CYSTOSCOPY/  NEPHROLITHOTOMY PERCUTANEOUS SECOND LOOK;  Surgeon: Bernestine Amass, MD;  Location: [facility];  Service: Urology;  Laterality: Left;  kidney        PERCUTANEOUS NEPHROLITHOTOMY  04-03-11   LEFT KIDNEY STAGHORN STONE (DR GRAPEY AT [facility])   TUBAL LIGATION  2003    Prior to Admission medications   Not on File    Allergies Fluticasone-salmeterol, Adhesive [tape], Advair diskus [fluticasone-salmeterol], and Azithromycin  Family History  Problem Relation Age of Onset   Breast cancer Maternal Grandmother 38   Lung cancer Maternal Grandmother    Diabetes Mother    Heart disease Mother    Heart disease Paternal Grandmother    Heart disease Paternal Grandfather     Social History Social History   Tobacco Use   Smoking status: Former Smoker    Packs/day: 0.50    Years: 12.00    Pack years: 6.00    Types: Cigarettes    Quit date: 05/28/2015    Years since quitting: 4.4   Smokeless tobacco: Never Used  Substance Use Topics   Alcohol use: No   Drug use: Yes    Types: Marijuana    Comment: 3 MON AGO    Review of Systems  Constitutional: No fever/chills Eyes: No visual changes. Cardiovascular: Denies chest pain. Respiratory: Denies shortness of breath. Gastrointestinal: No abdominal pain.  No nausea, no vomiting.  No diarrhea.  No constipation. Genitourinary:  Negative for dysuria. Musculoskeletal: + For pain of the tailbone, + elbow pain Skin: Negative for rash. Neurological: Negative for headaches, focal weakness or numbness.   ____________________________________________   PHYSICAL EXAM:  VITAL SIGNS: ED Triage Vitals [10/23/19 1107]  Enc Vitals Group     BP (!) 143/92     Pulse Rate 97     Resp 20     Temp 98 F (36.7 C)     Temp Source Oral     SpO2 100 %     Weight 145 lb (65.8 kg)     Height 5\' 7"  (1.702 m)     Head Circumference      Peak Flow      Pain Score 7     Pain Loc      Pain Edu?      Excl. in [place]?     Constitutional: Alert and  oriented. Well appearing and in no acute distress. Eyes: Conjunctivae are normal. PERRL. EOMI. Head: Atraumatic. Neck: No stridor.  No cervical spine tenderness to palpation. Cardiovascular: Normal rate, regular rhythm. Grossly normal heart sounds.  Good peripheral circulation. Respiratory: Normal respiratory effort.  No retractions. Lungs CTAB. Gastrointestinal: Soft and nontender. No distention. No abdominal bruits. No CVA tenderness. Musculoskeletal: There is tenderness to palpation over the midline sacrum region.  No tenderness to palpation of the lumbar spine or paraspinals.  There is tenderness to palpation of the olecranon of the right elbow.  There is no tenderness of the condyles.  the patient has full range of motion of the right elbow and wrist.  No joint effusions. Neurologic:  Normal speech and language. No gross focal neurologic deficits are appreciated. No gait instability. Skin:  Skin is warm, dry and intact. No rash noted. Psychiatric: Mood and affect are normal. Speech and behavior are normal.   ____________________________________________  RADIOLOGY   Official radiology report(s): DG Sacrum/Coccyx  Result Date: 10/23/2019 CLINICAL DATA:  Fall with sacrococcygeal pain. EXAM: SACRUM AND COCCYX - 2+ VIEW COMPARISON:  None. FINDINGS: There is no evidence of fracture or other focal bone lesions. IMPRESSION: Negative. Electronically Signed   By: Nelson Chimes M.D.   On: 10/23/2019 12:28   DG Elbow Complete Right  Result Date: 10/23/2019 CLINICAL DATA:  Fall with elbow pain. EXAM: RIGHT ELBOW - COMPLETE 3+ VIEW COMPARISON:  None. FINDINGS: There is no evidence of fracture, dislocation, or joint effusion. There is no evidence of arthropathy or other focal bone abnormality. Soft tissues are unremarkable. IMPRESSION: Negative. Electronically Signed   By: Nelson Chimes M.D.   On: 10/23/2019 12:27    ____________________________________________   INITIAL IMPRESSION / ASSESSMENT  AND PLAN / ED COURSE  As part of my medical decision making, I reviewed the following data within the [place] notes reviewed and incorporated        Kristy Wallace is a 49 year old female who presents to the emergency room for evaluation of her sacrum and right elbow following a fall at work this morning.  The patient's exam is reassuring for no obvious deformities, full range of motion and mild tenderness.  X-rays of the sacrum and coccyx as well as right elbow are reassuring for no acute abnormalities.  It was recommended that the patient alternate ibuprofen and Tylenol as needed for pain.  She was given a work note to keep her out for the next few days.  She will follow up with her primary care should she have any ongoing pain or  symptoms from this fall.  Kristy Wallace was evaluated in Emergency Department on 10/23/2019 for the symptoms described in the history of present illness. She was evaluated in the context of the global COVID-19 pandemic, which necessitated consideration that the patient might be at risk for infection with the SARS-CoV-2 virus that causes COVID-19. Institutional protocols and algorithms that pertain to the evaluation of patients at risk for COVID-19 are in a state of rapid change based on information released by regulatory bodies including the CDC and federal and state organizations. These policies and algorithms were followed during the patient's care in the ED.       ____________________________________________   FINAL CLINICAL IMPRESSION(S) / ED DIAGNOSES  Final diagnoses:  Fall, initial encounter  Tailbone injury, initial encounter  Contusion of right elbow, initial encounter     ED Discharge Orders    None       Note:  This document was prepared using Dragon voice recognition software and may include unintentional dictation errors.    Marlana Salvage, PA 10/23/19 1446    Delman Kitten, MD 10/23/19 [phone]

## 2020-06-21 ENCOUNTER — Ambulatory Visit
Admit: 2020-06-21 | Discharge: 2020-06-21 | Payer: PRIVATE HEALTH INSURANCE | Attending: Student in an Organized Health Care Education/Training Program | Primary: Student in an Organized Health Care Education/Training Program

## 2020-06-21 ENCOUNTER — Ambulatory Visit: Admit: 2020-06-21 | Discharge: 2020-06-21 | Payer: PRIVATE HEALTH INSURANCE

## 2020-06-21 DIAGNOSIS — R21 Rash and other nonspecific skin eruption: Principal | ICD-10-CM

## 2020-06-21 MED ORDER — TRIAMCINOLONE ACETONIDE 0.1 % TOPICAL OINTMENT
Freq: Two times a day (BID) | TOPICAL | 3 refills | 0.00000 days | Status: CP
Start: 2020-06-21 — End: 2021-06-21

## 2020-06-21 MED ORDER — DOXYCYCLINE HYCLATE 100 MG TABLET
ORAL_TABLET | Freq: Two times a day (BID) | ORAL | 0 refills | 14.00000 days | Status: CP
Start: 2020-06-21 — End: ?

## 2020-07-06 DIAGNOSIS — R21 Rash and other nonspecific skin eruption: Principal | ICD-10-CM

## 2020-07-19 ENCOUNTER — Ambulatory Visit
Admit: 2020-07-19 | Discharge: 2020-07-20 | Payer: PRIVATE HEALTH INSURANCE | Attending: Student in an Organized Health Care Education/Training Program | Primary: Student in an Organized Health Care Education/Training Program

## 2020-07-19 DIAGNOSIS — L705 Acne excoriee des jeunes filles: Principal | ICD-10-CM

## 2020-07-19 MED ORDER — MUPIROCIN 2 % TOPICAL OINTMENT
TOPICAL | 5 refills | 0.00000 days | Status: CP
Start: 2020-07-19 — End: ?

## 2020-07-19 MED ORDER — DOXYCYCLINE HYCLATE 50 MG CAPSULE
ORAL_CAPSULE | Freq: Two times a day (BID) | ORAL | 1 refills | 30.00000 days | Status: CP
Start: 2020-07-19 — End: ?

## 2020-07-22 ENCOUNTER — Other Ambulatory Visit: Payer: Self-pay

## 2020-07-22 ENCOUNTER — Emergency Department
Admission: EM | Admit: 2020-07-22 | Discharge: 2020-07-22 | Disposition: A | Discharge status: Home / Self Care | Payer: Medicaid Other | Attending: Emergency Medicine | Admitting: Emergency Medicine

## 2020-07-22 ENCOUNTER — Encounter: Payer: Self-pay | Admitting: Emergency Medicine

## 2020-07-22 DIAGNOSIS — R300 Dysuria: Secondary | ICD-10-CM | POA: Diagnosis present

## 2020-07-22 DIAGNOSIS — N3001 Acute cystitis with hematuria: Secondary | ICD-10-CM | POA: Diagnosis not present

## 2020-07-22 DIAGNOSIS — Z87891 Personal history of nicotine dependence: Secondary | ICD-10-CM | POA: Insufficient documentation

## 2020-07-22 DIAGNOSIS — Z85828 Personal history of other malignant neoplasm of skin: Secondary | ICD-10-CM | POA: Insufficient documentation

## 2020-07-22 DIAGNOSIS — J45909 Unspecified asthma, uncomplicated: Secondary | ICD-10-CM | POA: Insufficient documentation

## 2020-07-22 LAB — URINALYSIS, COMPLETE (UACMP) WITH MICROSCOPIC
Bilirubin Urine: NEGATIVE
Glucose, UA: NEGATIVE mg/dL
Ketones, ur: NEGATIVE mg/dL
Nitrite: NEGATIVE
Protein, ur: NEGATIVE mg/dL
Specific Gravity, Urine: 1.003 — ABNORMAL LOW (ref 1.005–1.030)
pH: 7 (ref 5.0–8.0)

## 2020-07-22 MED ORDER — CEFTRIAXONE SODIUM 1 G IJ SOLR
1.0000 g | Freq: Once | INTRAMUSCULAR | Status: AC
  Administered 2020-07-22: 1 g via INTRAMUSCULAR
  Filled 2020-07-22: qty 10

## 2020-07-22 MED ORDER — CEPHALEXIN 500 MG PO CAPS: 500.0000 mg | ORAL_CAPSULE | Freq: Four times a day (QID) | ORAL | 0 refills | Status: AC

## 2020-07-22 MED ORDER — CEPHALEXIN 500 MG PO CAPS: 500.0000 mg | ORAL_CAPSULE | Freq: Four times a day (QID) | ORAL | 0 refills | Status: DC

## 2020-07-22 NOTE — Discharge Instructions (Signed)
Take Keflex four times daily for the next seven days.  

## 2020-07-22 NOTE — ED Provider Notes (Signed)
[facility]-EMERGENCY DEPARTMENT  ____________________________________________  Time seen: Approximately 7:43 PM  I have reviewed the triage vital signs and the nursing notes.   HISTORY  Chief Complaint Urinary Frequency   Historian Patient     HPI Kristy Wallace is a 50 y.o. female presents to the emergency department with dysuria and increased urinary frequency for the past 2 to 3 days as well as increased urinary frequency and low-grade fever that started today.  Patient states that she has had bilateral flank pain.  She has a history of nephrolithiasis and reports that symptoms do not feel similar.  She denies vomiting but states that she has had nausea.  No chest pain, chest tightness or abdominal pain.   Past Medical History:  Diagnosis Date  . Asthma    exercise induced asthma  . Bladder infection    hx of bladder infection  . Cancer ([place])    skin  . Chronic tonsillitis   . Frequent urination at night   . H/O nephrostomy LEFT - PATENT  . HPV (human papilloma virus) infection   . Kidney stones   . Left ureteral calculus   . Nocturia   . Renal calculus, left   . Tonsillar hypertrophy      Immunizations up to date:  Yes.     Past Medical History:  Diagnosis Date  . Asthma    exercise induced asthma  . Bladder infection    hx of bladder infection  . Cancer ([place])    skin  . Chronic tonsillitis   . Frequent urination at night   . H/O nephrostomy LEFT - PATENT  . HPV (human papilloma virus) infection   . Kidney stones   . Left ureteral calculus   . Nocturia   . Renal calculus, left   . Tonsillar hypertrophy     Patient Active Problem List   Diagnosis Date Noted  . Iron deficiency anemia 10/11/2018  . Thrombocytosis 05/21/2017    Past Surgical History:  Procedure Laterality Date  . colposcopy  2011  . CYSTOSCOPY/RETROGRADE/URETEROSCOPY  05/15/2011   Procedure: CYSTOSCOPY/RETROGRADE/URETEROSCOPY;  Surgeon: Bernestine Amass, MD;  Location: [facility];  Service: Urology;  Laterality: Left;  . DILATION AND CURETTAGE OF UTERUS  1993  . NEPHROLITHOTOMY  04/24/2011   Procedure: FLEXIABLE CYSTOSCOPY/ NEPHROLITHOTOMY PERCUTANEOUS SECOND LOOK;  Surgeon: Bernestine Amass, MD;  Location: [facility];  Service: Urology;  Laterality: Left;  kidney       . PERCUTANEOUS NEPHROLITHOTOMY  04-03-11   LEFT KIDNEY STAGHORN STONE (DR GRAPEY AT [facility])  . TUBAL LIGATION  2003    Prior to Admission medications   Medication Sig Start Date End Date Taking? Authorizing Provider  cephALEXin (KEFLEX) 500 MG capsule Take 1 capsule (500 mg total) by mouth 4 (four) times daily for 7 days. 07/22/20 07/29/20 Yes Vallarie Mare M, PA-C    Allergies Fluticasone-salmeterol, Adhesive [tape], Advair diskus [fluticasone-salmeterol], and Azithromycin  Family History  Problem Relation Age of Onset  . Breast cancer Maternal Grandmother 30  . Lung cancer Maternal Grandmother   . Diabetes Mother   . Heart disease Mother   . Heart disease Paternal Grandmother   . Heart disease Paternal Grandfather     Social History Social History   Tobacco Use  . Smoking status: Former Smoker    Packs/day: 0.50    Years: 12.00    Pack years: 6.00    Types: Cigarettes    Quit date: 05/28/2015    Years since  quitting: 5.1  . Smokeless tobacco: Never Used  Substance Use Topics  . Alcohol use: No  . Drug use: Yes    Types: Marijuana    Comment: 3 MON AGO     Review of Systems  Constitutional: No fever/chills Eyes:  No discharge ENT: No upper respiratory complaints. Respiratory: no cough. No SOB/ use of accessory muscles to breath Gastrointestinal:   No nausea, no vomiting.  No diarrhea.  No constipation. Genitourinary: Patient has dysuria.  Musculoskeletal: Negative for musculoskeletal pain. Skin: Negative for rash, abrasions, lacerations, ecchymosis.    ____________________________________________   PHYSICAL EXAM:  VITAL SIGNS: ED Triage Vitals  Enc  Vitals Group     BP 07/22/20 1735 127/89     Pulse Rate 07/22/20 1735 (!) 110     Resp 07/22/20 1735 20     Temp 07/22/20 1735 98.4 F (36.9 C)     Temp Source 07/22/20 1735 Oral     SpO2 07/22/20 1735 99 %     Weight 07/22/20 1734 140 lb (63.5 kg)     Height 07/22/20 1734 5\' 7"  (1.702 m)     Head Circumference --      Peak Flow --      Pain Score 07/22/20 1733 7     Pain Loc --      Pain Edu? --      Excl. in [place]? --      Constitutional: Alert and oriented. Well appearing and in no acute distress. Eyes: Conjunctivae are normal. PERRL. EOMI. Head: Atraumatic. ENT:      Ears:       Nose: No congestion/rhinnorhea.      Mouth/Throat: Mucous membranes are moist.  Neck: No stridor.  No cervical spine tenderness to palpation. Cardiovascular: Normal rate, regular rhythm. Normal S1 and S2.  Good peripheral circulation. Respiratory: Normal respiratory effort without tachypnea or retractions. Lungs CTAB. Good air entry to the bases with no decreased or absent breath sounds Gastrointestinal: Bowel sounds x 4 quadrants. Soft and nontender to palpation. No guarding or rigidity. No distention. Patient has CVA tenderness bilaterally.  Musculoskeletal: Full range of motion to all extremities. No obvious deformities noted Neurologic:  Normal for age. No gross focal neurologic deficits are appreciated.  Skin:  Skin is warm, dry and intact. No rash noted. Psychiatric: Mood and affect are normal for age. Speech and behavior are normal.   ____________________________________________   LABS (all labs ordered are listed, but only abnormal results are displayed)  Labs Reviewed  URINALYSIS, COMPLETE (UACMP) WITH MICROSCOPIC - Abnormal; Notable for the following components:      Result Value   Color, Urine STRAW (*)    APPearance CLEAR (*)    Specific Gravity, Urine 1.003 (*)    Hgb urine dipstick MODERATE (*)    Leukocytes,Ua MODERATE (*)    Bacteria, UA FEW (*)    All other components within  normal limits   ____________________________________________  EKG   ____________________________________________  RADIOLOGY   No results found.  ____________________________________________    PROCEDURES  Procedure(s) performed:     Procedures     Medications  cefTRIAXone (ROCEPHIN) injection 1 g (1 g Intramuscular Given 07/22/20 1958)     ____________________________________________   INITIAL IMPRESSION / ASSESSMENT AND PLAN / ED COURSE  Pertinent labs & imaging results that were available during my care of the patient were reviewed by me and considered in my medical decision making (see chart for details).       Assessment and  plan Urinary tract infection 50 year old female presents to the emergency department with dysuria and increased urinary frequency for the past 2 to 3 days.  Patient was mildly tachycardic at triage but vital signs were otherwise reassuring.  Urinalysis shows moderate amount of blood, leukocytes and a few bacteria.  We will treat with IM Rocephin and Keflex 4 times daily for the next 7 days.  Patient was cautioned to return to the emergency department with new or worsening symptoms.  All patient questions were answered.    ____________________________________________  FINAL CLINICAL IMPRESSION(S) / ED DIAGNOSES  Final diagnoses:  Acute cystitis with hematuria      NEW MEDICATIONS STARTED DURING THIS VISIT:  ED Discharge Orders         Ordered    cephALEXin (KEFLEX) 500 MG capsule  4 times daily        07/22/20 1948              This chart was dictated using voice recognition software/Dragon. Despite best efforts to proofread, errors can occur which can change the meaning. Any change was purely unintentional.     Karren Cobble 07/22/20 2011    Nance Pear, MD 07/22/20 [phone]

## 2020-07-22 NOTE — ED Notes (Signed)
Patient updated on POC.

## 2020-07-22 NOTE — ED Notes (Signed)
Patient denies itching, pain, redness or swelling at injection site.

## 2020-07-22 NOTE — ED Notes (Signed)
Took Motrin at apx 1400

## 2020-07-22 NOTE — ED Triage Notes (Signed)
Pt reports thinks she has a UTI. Pt states some frequency and urgency and back discomfort. States hx of UTI that felt the same

## 2020-07-24 ENCOUNTER — Emergency Department
Admission: EM | Admit: 2020-07-24 | Discharge: 2020-07-24 | Disposition: A | Discharge status: Home / Self Care | Payer: Medicaid Other | Attending: Emergency Medicine | Admitting: Emergency Medicine

## 2020-07-24 ENCOUNTER — Emergency Department: Payer: Medicaid Other

## 2020-07-24 ENCOUNTER — Other Ambulatory Visit: Payer: Self-pay

## 2020-07-24 DIAGNOSIS — J45909 Unspecified asthma, uncomplicated: Secondary | ICD-10-CM | POA: Insufficient documentation

## 2020-07-24 DIAGNOSIS — E876 Hypokalemia: Secondary | ICD-10-CM | POA: Diagnosis not present

## 2020-07-24 DIAGNOSIS — Z85828 Personal history of other malignant neoplasm of skin: Secondary | ICD-10-CM | POA: Insufficient documentation

## 2020-07-24 DIAGNOSIS — R0602 Shortness of breath: Secondary | ICD-10-CM | POA: Diagnosis not present

## 2020-07-24 DIAGNOSIS — R42 Dizziness and giddiness: Secondary | ICD-10-CM | POA: Diagnosis not present

## 2020-07-24 DIAGNOSIS — Z87891 Personal history of nicotine dependence: Secondary | ICD-10-CM | POA: Diagnosis not present

## 2020-07-24 LAB — COMPREHENSIVE METABOLIC PANEL
ALT: 20 U/L (ref 0–44)
AST: 26 U/L (ref 15–41)
Albumin: 3.5 g/dL (ref 3.5–5.0)
Alkaline Phosphatase: 92 U/L (ref 38–126)
Anion gap: 7 (ref 5–15)
BUN: 8 mg/dL (ref 6–20)
CO2: 25 mmol/L (ref 22–32)
Calcium: 8.9 mg/dL (ref 8.9–10.3)
Chloride: 104 mmol/L (ref 98–111)
Creatinine, Ser: 0.66 mg/dL (ref 0.44–1.00)
GFR, Estimated: >60 mL/min (ref >60)
Glucose, Bld: 78 mg/dL (ref 70–99)
Potassium: 3 mmol/L — ABNORMAL LOW (ref 3.5–5.1)
Sodium: 136 mmol/L (ref 135–145)
Total Bilirubin: 0.4 mg/dL (ref 0.3–1.2)
Total Protein: 6.9 g/dL (ref 6.5–8.1)

## 2020-07-24 LAB — CBC
HCT: 39.2 % (ref 36.0–46.0)
Hemoglobin: 13.2 g/dL (ref 12.0–15.0)
MCH: 30.1 pg (ref 26.0–34.0)
MCHC: 33.7 g/dL (ref 30.0–36.0)
MCV: 89.3 fL (ref 80.0–100.0)
Platelets: 354 10*3/uL (ref 150–400)
RBC: 4.39 MIL/uL (ref 3.87–5.11)
RDW: 13.6 % (ref 11.5–15.5)
WBC: 13.6 10*3/uL — ABNORMAL HIGH (ref 4.0–10.5)
nRBC: 0 % (ref 0.0–0.2)

## 2020-07-24 MED ORDER — POTASSIUM CHLORIDE CRYS ER 20 MEQ PO TBCR
60.0000 meq | EXTENDED_RELEASE_TABLET | Freq: Once | ORAL | Status: AC
  Administered 2020-07-24: 60 meq via ORAL
  Filled 2020-07-24: qty 3

## 2020-07-24 MED ORDER — CIPROFLOXACIN HCL 500 MG PO TABS: 500.0000 mg | ORAL_TABLET | Freq: Two times a day (BID) | ORAL | 0 refills | Status: AC

## 2020-07-24 NOTE — ED Triage Notes (Addendum)
Pt reports shortness of breath, fever, and dizziness since yesterday noticed after she started taking Keflex. Seen here yesterday and dx with UTI.

## 2020-07-24 NOTE — ED Provider Notes (Signed)
[facility] Emergency Department Provider Note  ____________________________________________  Time seen: Approximately 7:36 AM  I have reviewed the triage vital signs and the nursing notes.   HISTORY  Chief Complaint Shortness of Breath    HPI Kristy Wallace is a 50 y.o. female with a past history of asthma, kidney stones who comes the ED complaining of shortness of breath and dizziness since yesterday when she started taking Keflex.  She was recently seen in the ED for urinary symptoms, diagnosed with cystitis, started on Keflex 4 times daily.  She took it all day yesterday, but today reports that she feels somewhat dizzy, short of breath.  No wheezing or cough.  No chest pain.  No other complaints, no rash.  Symptoms are constant, waxing and waning, no aggravating or alleviating factors.  No vomiting, drinking fluids okay.      Past Medical History:  Diagnosis Date  . Asthma    exercise induced asthma  . Bladder infection    hx of bladder infection  . Cancer ([place])    skin  . Chronic tonsillitis   . Frequent urination at night   . H/O nephrostomy LEFT - PATENT  . HPV (human papilloma virus) infection   . Kidney stones   . Left ureteral calculus   . Nocturia   . Renal calculus, left   . Tonsillar hypertrophy      Patient Active Problem List   Diagnosis Date Noted  . Iron deficiency anemia 10/11/2018  . Thrombocytosis 05/21/2017     Past Surgical History:  Procedure Laterality Date  . colposcopy  2011  . CYSTOSCOPY/RETROGRADE/URETEROSCOPY  05/15/2011   Procedure: CYSTOSCOPY/RETROGRADE/URETEROSCOPY;  Surgeon: Bernestine Amass, MD;  Location: [facility];  Service: Urology;  Laterality: Left;  . DILATION AND CURETTAGE OF UTERUS  1993  . NEPHROLITHOTOMY  04/24/2011   Procedure: FLEXIABLE CYSTOSCOPY/ NEPHROLITHOTOMY PERCUTANEOUS SECOND LOOK;  Surgeon: Bernestine Amass, MD;  Location: [facility];  Service: Urology;  Laterality: Left;   kidney       . PERCUTANEOUS NEPHROLITHOTOMY  04-03-11   LEFT KIDNEY STAGHORN STONE (DR GRAPEY AT [facility])  . TUBAL LIGATION  2003     Prior to Admission medications   Medication Sig Start Date End Date Taking? Authorizing Provider  ciprofloxacin (CIPRO) 500 MG tablet Take 1 tablet (500 mg total) by mouth 2 (two) times daily for 5 days. 07/24/20 07/29/20 Yes Carrie Mew, MD  cephALEXin (KEFLEX) 500 MG capsule Take 1 capsule (500 mg total) by mouth 4 (four) times daily for 7 days. 07/22/20 07/29/20  Lannie Fields, PA-C     Allergies Fluticasone-salmeterol, Adhesive [tape], Advair diskus [fluticasone-salmeterol], and Azithromycin   Family History  Problem Relation Age of Onset  . Breast cancer Maternal Grandmother 21  . Lung cancer Maternal Grandmother   . Diabetes Mother   . Heart disease Mother   . Heart disease Paternal Grandmother   . Heart disease Paternal Grandfather     Social History Social History   Tobacco Use  . Smoking status: Former Smoker    Packs/day: 0.50    Years: 12.00    Pack years: 6.00    Types: Cigarettes    Quit date: 05/28/2015    Years since quitting: 5.1  . Smokeless tobacco: Never Used  Substance Use Topics  . Alcohol use: No  . Drug use: Yes    Types: Marijuana    Comment: 3 MON AGO    Review of Systems  Constitutional:  No fever or chills.  ENT:   No sore throat. No rhinorrhea. Cardiovascular:   No chest pain or syncope. Respiratory: Positive shortness of breath without cough. Gastrointestinal:   Negative for abdominal pain, vomiting and diarrhea.  Musculoskeletal:   Negative for focal pain or swelling All other systems reviewed and are negative except as documented above in ROS and HPI.  ____________________________________________   PHYSICAL EXAM:  VITAL SIGNS: ED Triage Vitals  Enc Vitals Group     BP 07/24/20 0416 127/77     Pulse Rate 07/24/20 0416 99     Resp 07/24/20 0416 20     Temp 07/24/20 0416 98.2 F (36.8  C)     Temp Source 07/24/20 0416 Oral     SpO2 07/24/20 0416 100 %     Weight 07/24/20 0416 140 lb (63.5 kg)     Height 07/24/20 0416 5\' 7"  (1.702 m)     Head Circumference --      Peak Flow --      Pain Score 07/24/20 0423 5     Pain Loc --      Pain Edu? --      Excl. in [place]? --     Vital signs reviewed, nursing assessments reviewed.   Constitutional:   Alert and oriented. Non-toxic appearance. Eyes:   Conjunctivae are normal. EOMI. PERRL. ENT      Head:   Normocephalic and atraumatic.      Nose:   Wearing a mask.      Mouth/Throat:   Wearing a mask.      Neck:   No meningismus. Full ROM. Hematological/Lymphatic/Immunilogical:   No cervical lymphadenopathy. Cardiovascular:   RRR heart rate 80. Symmetric bilateral radial and DP pulses.  No murmurs. Cap refill less than 2 seconds. Respiratory:   Normal respiratory effort without tachypnea/retractions, respiratory rate 18. Breath sounds are clear and equal bilaterally. No wheezes/rales/rhonchi. Gastrointestinal:   Soft and nontender. Non distended. There is no CVA tenderness.  No rebound, rigidity, or guarding. Genitourinary:   deferred Musculoskeletal:   Normal range of motion in all extremities. No joint effusions.  No lower extremity tenderness.  No edema. Neurologic:   Normal speech and language.  Motor grossly intact. No acute focal neurologic deficits are appreciated.  Skin:    Skin is warm, dry and intact. No rash noted.  No petechiae, purpura, or bullae.  ____________________________________________    LABS (pertinent positives/negatives) (all labs ordered are listed, but only abnormal results are displayed) Labs Reviewed  CBC - Abnormal; Notable for the following components:      Result Value   WBC 13.6 (*)    All other components within normal limits  COMPREHENSIVE METABOLIC PANEL - Abnormal; Notable for the following components:   Potassium 3.0 (*)    All other components within normal limits    ____________________________________________   EKG  Interpreted by me Sinus tachycardia rate 104.  Normal axis and intervals.  Normal QRS ST segments and T waves.  1 PAC on the strip.  ____________________________________________    RADIOLOGY  DG Chest 2 View  Result Date: 07/24/2020 CLINICAL DATA:  Shortness of breath. EXAM: CHEST - 2 VIEW COMPARISON:  Cxr 01/07/10 FINDINGS: The heart size and mediastinal contours are within normal limits. Biapical pleural/pulmonary scarring. No focal consolidation. No pulmonary edema. No pleural effusion. No pneumothorax. No acute osseous abnormality. IMPRESSION: No active cardiopulmonary disease. Electronically Signed   By: Iven Finn M.D.   On: 07/24/2020 06:16  ____________________________________________   PROCEDURES Procedures  ____________________________________________    CLINICAL IMPRESSION / ASSESSMENT AND PLAN / ED COURSE  Medications ordered in the ED: Medications  potassium chloride SA (KLOR-CON) CR tablet 60 mEq (has no administration in time range)    Pertinent labs & imaging results that were available during my care of the patient were reviewed by me and considered in my medical decision making (see chart for details).  Kristy Wallace was evaluated in Emergency Department on 07/24/2020 for the symptoms described in the history of present illness. She was evaluated in the context of the global COVID-19 pandemic, which necessitated consideration that the patient might be at risk for infection with the SARS-CoV-2 virus that causes COVID-19. Institutional protocols and algorithms that pertain to the evaluation of patients at risk for COVID-19 are in a state of rapid change based on information released by regulatory bodies including the CDC and federal and state organizations. These policies and algorithms were followed during the patient's care in the ED.   Patient presents with symptoms of fever dizziness and shortness of  breath.  Vital signs here are normal, exam is normal and she is nontoxic.  Calm and comfortable.  Labs unremarkable except for mild hypokalemia, will replace orally.  Chest x-ray unremarkable.  No wheezing on exam.   Considering the patient's symptoms, medical history, and physical examination today, I have low suspicion for ACS, PE, TAD, pneumothorax, carditis, mediastinitis, pneumonia, CHF, or sepsis.  Doubt allergic reaction/drug reaction  Unclear the cause of the symptoms, but it evaluation is reassuring.  I do think she is having an allergic reaction, but with these unusual symptoms, will change Keflex to Cipro, recommended she follow-up with her doctor in about a week for reassessment.      ____________________________________________   FINAL CLINICAL IMPRESSION(S) / ED DIAGNOSES    Final diagnoses:  Shortness of breath     ED Discharge Orders         Ordered    ciprofloxacin (CIPRO) 500 MG tablet  2 times daily        07/24/20 0736          Portions of this note were generated with dragon dictation software. Dictation errors may occur despite best attempts at proofreading.   Carrie Mew, MD 07/24/20 [phone]

## 2020-09-28 ENCOUNTER — Ambulatory Visit: Admit: 2020-09-28 | Discharge: 2020-09-29 | Payer: PRIVATE HEALTH INSURANCE

## 2020-09-28 DIAGNOSIS — R21 Rash and other nonspecific skin eruption: Principal | ICD-10-CM

## 2020-09-28 DIAGNOSIS — L705 Acne excoriee des jeunes filles: Principal | ICD-10-CM

## 2020-09-28 MED ORDER — DOXYCYCLINE HYCLATE 50 MG CAPSULE
ORAL_CAPSULE | Freq: Two times a day (BID) | ORAL | 6 refills | 30.00000 days | Status: CP
Start: 2020-09-28 — End: ?

## 2020-09-28 MED ORDER — MUPIROCIN 2 % TOPICAL OINTMENT
TOPICAL | 11 refills | 0.00000 days | Status: CP
Start: 2020-09-28 — End: ?

## 2020-09-28 MED ORDER — TRIAMCINOLONE ACETONIDE 0.1 % TOPICAL OINTMENT
Freq: Two times a day (BID) | TOPICAL | 5 refills | 0.00000 days | Status: CP
Start: 2020-09-28 — End: 2021-09-28

## 2020-10-02 ENCOUNTER — Ambulatory Visit: Admit: 2020-10-02 | Discharge: 2020-10-03 | Payer: PRIVATE HEALTH INSURANCE

## 2020-10-02 DIAGNOSIS — R21 Rash and other nonspecific skin eruption: Principal | ICD-10-CM

## 2020-12-09 ENCOUNTER — Ambulatory Visit: Admit: 2020-12-09 | Discharge: 2020-12-10 | Payer: PRIVATE HEALTH INSURANCE

## 2020-12-09 DIAGNOSIS — L705 Acne excoriee des jeunes filles: Principal | ICD-10-CM

## 2020-12-09 MED ORDER — MUPIROCIN 2 % TOPICAL OINTMENT
TOPICAL | 6 refills | 0.00000 days | Status: CP
Start: 2020-12-09 — End: ?

## 2020-12-22 ENCOUNTER — Ambulatory Visit: Payer: Self-pay | Admitting: Dermatology

## 2021-01-25 DIAGNOSIS — L705 Acne excoriee des jeunes filles: Principal | ICD-10-CM

## 2021-01-25 MED ORDER — MUPIROCIN 2 % TOPICAL OINTMENT
TOPICAL | 6 refills | 0.00000 days | Status: CP
Start: 2021-01-25 — End: ?

## 2021-02-03 ENCOUNTER — Ambulatory Visit: Admit: 2021-02-03 | Discharge: 2021-02-04 | Payer: PRIVATE HEALTH INSURANCE

## 2021-02-03 DIAGNOSIS — L281 Prurigo nodularis: Principal | ICD-10-CM

## 2021-02-03 MED ORDER — DUPILUMAB 300 MG/2 ML SUBCUTANEOUS PEN INJECTOR
SUBCUTANEOUS | 0 refills | 0.00000 days | Status: CP
Start: 2021-02-03 — End: ?
  Filled 2021-02-16: qty 4, 14d supply, fill #0

## 2021-02-04 DIAGNOSIS — L281 Prurigo nodularis: Principal | ICD-10-CM

## 2021-02-12 NOTE — Unmapped (Signed)
[facility] Specialty Medication Onboarding    Specialty Medication: Dupixent pen 300mg /58ml  Prior Authorization: Approved   Financial Assistance: No - copay  <$25  Final Copay/Day Supply: $4 / 14 (load) $4 / 28 (maintenance)    Insurance Restrictions: None     Notes to Pharmacist:     The triage team has completed the benefits investigation and has determined that the patient is able to fill this medication at [facility]. Please contact the patient to complete the onboarding or follow up with the prescribing physician as needed.

## 2021-02-14 NOTE — Unmapped (Unsigned)
*in addition to reviewing the information below, I also sent a link to an injection training video to the patient's email.      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  I am speaking to the patient.    Was a Nurse, learning disability used for this call? No    Verified patient's date of birth / HIPAA.    Specialty medication(s) to be sent: Inflammatory Disorders: Dupixent      Non-specialty medications/supplies to be sent: sharps kit      Medications not needed at this time: na         Dupixent (dupilumab)    Medication & Administration     Dosage: Prurigo nodularis: SUBQ: 600 mg once (given as two 300 mg injections), followed by 300 mg once every other week.    Administration:     Dupixent Pen  1. Gather all supplies needed for injection on a clean, flat working surface: medication syringe removed from packaging, alcohol swab, sharps container, etc.  2. Look at the medication label - look for correct medication, correct dose, and check the expiration date  3. Look at the medication - the liquid in the pen should appear clear and colorless to pale yellow  4. Lay the pen on a flat surface and allow it to warm up to room temperature for at least 45 minutes  5. Select injection site - you can use the front of your thigh or your belly (but not the area 2 inches around your belly button); if someone else is giving you the injection you can also use your upper arm in the skin covering your triceps muscle  6. Prepare injection site - wash your hands and clean the skin at the injection site with an alcohol swab and let it air dry, do not touch the injection site again before the injection  7. Hold the middle of the body of the pen and gently pull the needle safety cap straight out. Be careful not to bend the needle. Do not remove until immediately prior to injection  8. Press the pen down onto the injection site at a 90 degree angle.   9. You will hear a click as the injection starts, and then a second click when the injection is ALMOST done. Keep holding the pen against the skin for 5 more seconds after the second click.   10. Check that the pen is empty by looking in the viewing window - the yellow indicator bar should be stopped, and should fill the window.   11. Remove the pen from the skin by lifting straight up.   12. Dispose of the used pen immediately in your sharps disposal container  13. If you see any blood at the injection site, press a cotton ball or gauze on the site and maintain pressure until the bleeding stops, do not rub the injection site    Adherence/Missed dose instructions:  If a dose is missed, administer within 7 days from the missed dose and then resume the original schedule. If the missed dose is not administered within 7 days, you can either wait until the next dose on the original schedule or take your dose now and resume every 14 days from the new injection date. Do not use 2 doses at the same time or extra doses.      Goals of  Therapy     -Reduce symptoms of pruritus and dermatitis  -Prevent exacerbations  -Minimize therapeutic risks  -Avoidance of long-term systemic and topical glucocorticoid use  -Maintenance of effective psychosocial functioning    Side Effects & Monitoring Parameters     ??? Injection site reaction (redness, irritation, inflammation localized to the site of administration)  ??? Signs of a common cold - minor sore throat, runny or stuffy nose, etc.  ??? Recurrence of cold sores (herpes simplex)      The following side effects should be reported to the provider:  ??? Signs of a hypersensitivity reaction - rash; hives; itching; red, swollen, blistered, or peeling skin; wheezing; tightness in the chest or throat; difficulty breathing, swallowing, or talking; swelling of the mouth, face, lips, tongue, or throat; etc.  ??? Eye pain or irritation or any visual disturbances  ??? Shortness of breath or worsening of breathing      Contraindications, Warnings, & Precautions     ??? Have your bloodwork checked as you have been told by your prescriber   ??? Birth control pills and other hormone-based birth control may not work as well to prevent pregnancy  ??? Talk with your doctor if you are pregnant, planning to become pregnant, or breastfeeding  ??? Discuss the possible need for holding your dose(s) of Dupixent?? when a planned procedure is scheduled with the prescriber as it may delay healing/recovery timeline       Drug/Food Interactions     ??? Medication list reviewed in Epic. The patient was instructed to inform the care team before taking any new medications or supplements. No drug interactions identified.   ??? Talk with you prescriber or pharmacist before receiving any live vaccinations while taking this medication and after you stop taking it    Storage, Handling Precautions, & Disposal     ??? Store this medication in the refrigerator.  Do not freeze  ??? If needed, you may store at room temperature for up to 14 days  ??? Store in original packaging, protected from light  ??? Do not shake  ??? Dispose of used syringes in a sharps disposal container          Current Medications (including OTC/herbals), Comorbidities and Allergies     Current Outpatient Medications   Medication Sig Dispense Refill   ??? baclofen (LIORESAL) 5 mg Tab tablet TAKE 1 TABLET BY MOUTH 2 TO 3 TIMES DAILY AS NEEDED FOR JAW PAIN (Patient not taking: Reported on 12/08/2020)     ??? doxycycline (VIBRAMYCIN) 50 MG capsule Take 1 capsule (50 mg total) by mouth Two (2) times a day. 60 capsule 6   ??? dupilumab 300 mg/2 mL PnIj Inject the contents of 2 pens (600mg ) as loading dose, then 1 pen (300mg ) every 14 days thereafter. 4 mL 0   ??? dupilumab 300 mg/2 mL PnIj Inject the contents of 1 pen (300 mg) under the skin every fourteen (14) days to treat prurigo. 4 mL 11   ??? meloxicam (MOBIC) 15 MG tablet TAKE 1 TABLET BY MOUTH EVERY DAY WITH A MEAL (Patient not taking: Reported on 12/08/2020)     ??? methocarbamoL (ROBAXIN) 500 MG tablet Take 500 mg by mouth.     ??? mupirocin (BACTROBAN) 2 % ointment Twice a day to open/crusted areas 90 g 6   ??? triamcinolone (KENALOG) 0.1 % ointment Apply topically Two (2) times a day. To red, scaly skin as needed until smooth. Do not use for longer than two weeks  in one area 80 g 5     No current facility-administered medications for this visit.       Allergies   Allergen Reactions   ??? Fluticasone Propion-Salmeterol Other (See Comments)     Flu-like symptoms.  Flu-like symptoms.  Flu Like Symptoms  Flu-like symptoms.  Flu-like symptoms.  Flu Like Symptoms     ??? Zithromax [Azithromycin] Shortness Of Breath and Other (See Comments)     dizziness   ??? Adhesive Tape-Silicones Itching and Rash     plastic   ??? Cephalexin    ??? Ciprofloxacin    ??? Varenicline        Patient Active Problem List   Diagnosis   ??? SCC (squamous cell carcinoma), face   ??? Personal history of skin cancer       Reviewed and up to date in Epic.    Appropriateness of Therapy     Acute infections noted within Epic:  No active infections  Patient reported infection: None    Is medication and dose appropriate based on diagnosis and infection status? Yes    Prescription has been clinically reviewed: Yes      Baseline Quality of Life Assessment      How many days over the past month did your prurigo  keep you from your normal activities? For example, brushing your teeth or getting up in the morning. 0    Financial Information     Medication Assistance provided: Prior Authorization    Anticipated copay of $4 reviewed with patient. Verified delivery address.    Delivery Information     Scheduled delivery date: Wed, 12/14 for load, and Wed, 12/28 for maintenance     Expected start date: Wed, 12/14    Medication will be delivered via Same Day Courier to the prescription address in [facility].  This shipment will not require a signature.      Explained the services we provide at [facility] and that each month we would call to set up refills.  Stressed importance of returning phone calls so that we could ensure they receive their medications in time each month.  Informed patient that we should be setting up refills 7-10 days prior to when they will run out of medication.  A pharmacist will reach out to perform a clinical assessment periodically.  Informed patient that a welcome packet, containing information about our pharmacy and other support services, a Notice of Privacy Practices, and a drug information handout will be sent.      The patient or caregiver noted above participated in the development of this care plan and knows that they can request review of or adjustments to the care plan at any time.      Patient or caregiver verbalized understanding of the above information as well as how to contact the pharmacy at [phone] option 4 with any questions/concerns.  The pharmacy is open Monday through Friday 8:30am-4:30pm.  A pharmacist is available 24/7 via pager to answer any clinical questions they may have.    Patient Specific Needs     - Does the patient have any physical, cognitive, or cultural barriers? No    - Does the patient have adequate living arrangements? (i.e. the ability to store and take their medication appropriately) Yes    - Did you identify any home environmental safety or security hazards? No    - Patient prefers to have medications discussed with  Patient     - Is  the patient or caregiver able to read and understand education materials at a high school level or above? Yes    - Patient's primary language is  English     - Is the patient high risk? No         Meredith Brooks A Desiree Lucy Shared [facility] Specialty Pharmacist trouble affording food, housing, utilities, or transportation can affect the health of many of our patients.   That is why we wanted to ask: are you currently experiencing any life circumstances that are negatively impacting your health and/or quality of life? {YES/NO/PATIENTDECLINED:93004}    Social Determinants of Health     Food Insecurity: Not on file   Tobacco Use: High Risk   ??? Smoking Tobacco Use: Every Day   ??? Smokeless Tobacco Use: Never   ??? Passive Exposure: Not on file   Transportation Needs: Not on file   Alcohol Use: Not on file   Housing/Utilities: Unknown   ??? Within the past 12 months, have you ever stayed: outside, in a car, in a tent, in an overnight shelter, or temporarily in someone else's home (i.e. couch-surfing)?: No   ??? Are you worried about losing your housing?: Not on file   ??? Within the past 12 months, have you been unable to get utilities (heat, electricity) when it was really needed?: Not on file   Substance Use: Not on file   Financial Resource Strain: Not on file   Physical Activity: Not on file   Health Literacy: Low Risk    ??? : Never   Stress: Not on file   Intimate Partner Violence: Not on file   Depression: Not at risk   ??? PHQ-2 Score: 0   Social Connections: Not on file       Would you be willing to receive help with any of the needs that you have identified today? {Yes/No/Not applicable:93005}       Meredith Brooks A Desiree Lucy [facility] Specialty Pharmacist

## 2021-02-15 MED ORDER — EMPTY CONTAINER
2 refills | 0 days
Start: 2021-02-15 — End: ?

## 2021-02-16 MED FILL — EMPTY CONTAINER: 120 days supply | Qty: 1 | Fill #0

## 2021-03-02 MED FILL — DUPIXENT 300 MG/2 ML SUBCUTANEOUS PEN INJECTOR: SUBCUTANEOUS | 28 days supply | Qty: 4 | Fill #0

## 2021-03-03 NOTE — Unmapped (Signed)
Notes from Rheumatology

## 2021-03-03 NOTE — Unmapped (Signed)
error 

## 2021-03-16 NOTE — Unmapped (Addendum)
I spoke with Meredith Brooks for a pharmacist check-in on her new Dupixent.    She is doing well on medication and has found it helpful for her symptoms. She informed me she now has [name], which requires her to fill Dupixent through [facility]. I provided their contact information. I'll alert clinic and ask them to send new order/start PA process for new plan.     ID: [id number]  RX group: [id number]  BIN: [id number]  PCV: [id number]    Meredith Brooks A. Meredith Brooks, PharmD, BCPS - Pharmacist   [facility]       Specialty Medication(s): Dupixent    Ms.Nolton has been dis-enrolled from the [facility] specialty pharmacy services due to a pharmacy change resulting from insurance limitations. The insurance company requires the patient fill at [facility].    Additional information provided to the patient: patient aware, new order has been forwarded to new pharmacy.    Meredith Brooks  [facility] Specialty Pharmacist

## 2021-03-17 DIAGNOSIS — L281 Prurigo nodularis: Principal | ICD-10-CM

## 2021-03-17 MED ORDER — DUPILUMAB 300 MG/2 ML SUBCUTANEOUS PEN INJECTOR
SUBCUTANEOUS | 11 refills | 28.00000 days | Status: CP
Start: 2021-03-17 — End: ?

## 2021-05-19 ENCOUNTER — Ambulatory Visit: Admit: 2021-05-19 | Discharge: 2021-05-20 | Payer: PRIVATE HEALTH INSURANCE

## 2021-05-19 DIAGNOSIS — L705 Acne excoriee des jeunes filles: Principal | ICD-10-CM

## 2021-05-19 DIAGNOSIS — L281 Prurigo nodularis: Principal | ICD-10-CM

## 2021-05-19 DIAGNOSIS — Z85828 Personal history of other malignant neoplasm of skin: Principal | ICD-10-CM

## 2021-10-06 DIAGNOSIS — L705 Acne excoriee des jeunes filles: Principal | ICD-10-CM

## 2021-10-06 MED ORDER — DOXYCYCLINE HYCLATE 50 MG CAPSULE
ORAL_CAPSULE | 6 refills | 0 days
Start: 2021-10-06 — End: ?

## 2021-10-07 MED ORDER — DOXYCYCLINE HYCLATE 50 MG CAPSULE
ORAL_CAPSULE | 6 refills | 0 days | Status: CP
Start: 2021-10-07 — End: ?

## 2021-11-24 ENCOUNTER — Ambulatory Visit: Admit: 2021-11-24 | Payer: PRIVATE HEALTH INSURANCE

## 2021-11-26 MED ORDER — TRIAMCINOLONE ACETONIDE 0.1 % TOPICAL OINTMENT
4 refills | 0 days
Start: 2021-11-26 — End: ?

## 2021-11-28 MED ORDER — TRIAMCINOLONE ACETONIDE 0.1 % TOPICAL OINTMENT
4 refills | 0 days | Status: CP
Start: 2021-11-28 — End: ?

## 2022-03-11 DIAGNOSIS — L705 Acne excoriee des jeunes filles: Principal | ICD-10-CM

## 2022-03-11 MED ORDER — MUPIROCIN 2 % TOPICAL OINTMENT
21 refills | 0 days
Start: 2022-03-11 — End: ?

## 2022-03-13 MED ORDER — MUPIROCIN 2 % TOPICAL OINTMENT
21 refills | 0 days | Status: CP
Start: 2022-03-13 — End: ?

## 2024-04-24 ENCOUNTER — Institutional Professional Consult (permissible substitution) (INDEPENDENT_AMBULATORY_CARE_PROVIDER_SITE_OTHER): Admitting: Otolaryngology
